# Patient Record
Sex: Female | Born: 1990 | Race: Black or African American | Hispanic: No | Marital: Single | Smoking: Never smoker
Health system: Southern US, Community
[De-identification: ages and names within clinical notes are randomized; demographics above are authoritative.]

## PROBLEM LIST (undated history)

## (undated) DIAGNOSIS — E669 Obesity, unspecified: Secondary | ICD-10-CM

## (undated) DIAGNOSIS — K219 Gastro-esophageal reflux disease without esophagitis: Secondary | ICD-10-CM

## (undated) HISTORY — DX: Obesity, unspecified: E66.9

---

## 2020-09-20 ENCOUNTER — Ambulatory Visit (INDEPENDENT_AMBULATORY_CARE_PROVIDER_SITE_OTHER): Payer: Self-pay | Admitting: Family Medicine

## 2020-09-20 ENCOUNTER — Other Ambulatory Visit: Payer: Self-pay

## 2020-09-20 ENCOUNTER — Other Ambulatory Visit: Payer: Self-pay | Admitting: Family Medicine

## 2020-09-20 VITALS — BP 118/82 | HR 83 | Ht 68.11 in | Wt 229.4 lb

## 2020-09-20 DIAGNOSIS — N926 Irregular menstruation, unspecified: Secondary | ICD-10-CM

## 2020-09-20 DIAGNOSIS — Z0289 Encounter for other administrative examinations: Secondary | ICD-10-CM

## 2020-09-20 DIAGNOSIS — Z3169 Encounter for other general counseling and advice on procreation: Secondary | ICD-10-CM | POA: Insufficient documentation

## 2020-09-20 LAB — POCT URINALYSIS DIP (MANUAL ENTRY)
Bilirubin, UA: NEGATIVE
Blood, UA: NEGATIVE
Glucose, UA: NEGATIVE mg/dL
Ketones, POC UA: NEGATIVE mg/dL
Leukocytes, UA: NEGATIVE
Nitrite, UA: NEGATIVE
Protein Ur, POC: NEGATIVE mg/dL
Spec Grav, UA: 1.01 (ref 1.010–1.025)
Urobilinogen, UA: 0.2 E.U./dL
pH, UA: 5.5 (ref 5.0–8.0)

## 2020-09-20 LAB — POCT URINE PREGNANCY: Preg Test, Ur: NEGATIVE

## 2020-09-20 NOTE — Patient Instructions (Addendum)
It was wonderful to see you today.  Thank you for choosing [facility].   - We will check blood work   - We will further evaluate your swallowing at follow up   Please call [phone] with any questions about today's appointment.  Follow up on October 25th at 850 AM (with [name])   Terisa Starr, MD  Family Medicine
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
## 2020-09-21 LAB — HCV AB W REFLEX TO QUANT PCR: HCV Ab: <0.1 s/co ratio (ref 0.0–0.9)

## 2020-09-21 LAB — HCV INTERPRETATION

## 2020-09-26 ENCOUNTER — Telehealth: Payer: Self-pay | Admitting: Family Medicine

## 2020-09-26 ENCOUNTER — Encounter: Payer: Self-pay | Admitting: Family Medicine

## 2020-09-26 NOTE — Telephone Encounter (Signed)
Patient only had part of refugee labs completed.   Negative Hep C. Needs remainder of labs at follow up.   Called with Swahili interpreter but mailbox full. Will send letter.   Terisa Starr, MD  Family Medicine Teaching Service

## 2020-10-17 ENCOUNTER — Other Ambulatory Visit: Payer: Medicaid Other

## 2020-10-17 ENCOUNTER — Other Ambulatory Visit: Payer: Self-pay

## 2020-10-17 ENCOUNTER — Ambulatory Visit (INDEPENDENT_AMBULATORY_CARE_PROVIDER_SITE_OTHER): Payer: Medicaid Other | Admitting: Family Medicine

## 2020-10-17 ENCOUNTER — Encounter: Payer: Self-pay | Admitting: Family Medicine

## 2020-10-17 VITALS — BP 110/80 | HR 80 | Ht 67.91 in | Wt 231.6 lb

## 2020-10-17 DIAGNOSIS — Z113 Encounter for screening for infections with a predominantly sexual mode of transmission: Secondary | ICD-10-CM | POA: Diagnosis not present

## 2020-10-17 DIAGNOSIS — F430 Acute stress reaction: Secondary | ICD-10-CM | POA: Diagnosis not present

## 2020-10-17 DIAGNOSIS — Z0000 Encounter for general adult medical examination without abnormal findings: Secondary | ICD-10-CM

## 2020-10-17 NOTE — Assessment & Plan Note (Signed)
Supportive listening provided. Offered CCM and SW, declined. Per patient's request, will reach out to patient's CM about housing.

## 2020-10-17 NOTE — Assessment & Plan Note (Signed)
We discussed healthy eating habits, moderate physical activity for 30 minutes 5 times per week (or 150 minutes), safe sex practices, avoiding tobacco products, safe alcohol consumption, and safe driving habits.   
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
## 2020-10-17 NOTE — Patient Instructions (Signed)
It was wonderful to see you today.  Please bring ALL of your medications with you to every visit.   Today we talked about: - Going to the lab--I will call you with results    Thank you for choosing [facility].   Please call [phone] with any questions about today's appointment.  Please be sure to schedule follow up at the front  desk before you leave today.   Terisa Starr, MD  Family Medicine

## 2020-10-18 LAB — CBC WITH DIFFERENTIAL/PLATELET
Basophils Absolute: 0 10*3/uL (ref 0.0–0.2)
Basos: 1 %
EOS (ABSOLUTE): 0.1 10*3/uL (ref 0.0–0.4)
Eos: 2 %
Hematocrit: 34.2 % (ref 34.0–46.6)
Hemoglobin: 10.2 g/dL — ABNORMAL LOW (ref 11.1–15.9)
Immature Grans (Abs): 0 10*3/uL (ref 0.0–0.1)
Immature Granulocytes: 0 %
Lymphocytes Absolute: 1.8 10*3/uL (ref 0.7–3.1)
Lymphs: 43 %
MCH: 20.7 pg — ABNORMAL LOW (ref 26.6–33.0)
MCHC: 29.8 g/dL — ABNORMAL LOW (ref 31.5–35.7)
MCV: 69 fL — ABNORMAL LOW (ref 79–97)
Monocytes Absolute: 0.4 10*3/uL (ref 0.1–0.9)
Monocytes: 9 %
Neutrophils Absolute: 1.8 10*3/uL (ref 1.4–7.0)
Neutrophils: 45 %
Platelets: 283 10*3/uL (ref 150–450)
RBC: 4.93 x10E6/uL (ref 3.77–5.28)
RDW: 17.7 % — ABNORMAL HIGH (ref 11.7–15.4)
WBC: 4.1 10*3/uL (ref 3.4–10.8)

## 2020-10-18 LAB — COMPREHENSIVE METABOLIC PANEL
ALT: 10 IU/L (ref 0–32)
AST: 10 IU/L (ref 0–40)
Albumin/Globulin Ratio: 1.4 (ref 1.2–2.2)
Albumin: 4.5 g/dL (ref 3.9–5.0)
Alkaline Phosphatase: 146 IU/L — ABNORMAL HIGH (ref 44–121)
BUN/Creatinine Ratio: 11 (ref 9–23)
BUN: 7 mg/dL (ref 6–20)
Bilirubin Total: <0.2 mg/dL (ref 0.0–1.2)
CO2: 20 mmol/L (ref 20–29)
Calcium: 9.5 mg/dL (ref 8.7–10.2)
Chloride: 103 mmol/L (ref 96–106)
Creatinine, Ser: 0.66 mg/dL (ref 0.57–1.00)
GFR calc Af Amer: 141 mL/min/{1.73_m2} (ref >59)
GFR calc non Af Amer: 122 mL/min/{1.73_m2} (ref >59)
Globulin, Total: 3.3 g/dL (ref 1.5–4.5)
Glucose: 88 mg/dL (ref 65–99)
Potassium: 4.2 mmol/L (ref 3.5–5.2)
Sodium: 135 mmol/L (ref 134–144)
Total Protein: 7.8 g/dL (ref 6.0–8.5)

## 2020-10-18 LAB — LIPID PANEL
Chol/HDL Ratio: 4.3 ratio (ref 0.0–4.4)
Cholesterol, Total: 193 mg/dL (ref 100–199)
HDL: 45 mg/dL (ref >39)
LDL Chol Calc (NIH): 137 mg/dL — ABNORMAL HIGH (ref 0–99)
Triglycerides: 60 mg/dL (ref 0–149)
VLDL Cholesterol Cal: 11 mg/dL (ref 5–40)

## 2020-10-18 LAB — HEPATITIS B SURFACE ANTIGEN: Hepatitis B Surface Ag: NEGATIVE

## 2020-10-18 LAB — RPR: RPR Ser Ql: NONREACTIVE

## 2020-10-18 LAB — HIV ANTIBODY (ROUTINE TESTING W REFLEX): HIV Screen 4th Generation wRfx: NONREACTIVE

## 2020-10-18 LAB — VARICELLA ZOSTER ANTIBODY, IGG: Varicella zoster IgG: 509 index (ref >165)

## 2020-10-18 LAB — TSH: TSH: 3.42 u[IU]/mL (ref 0.450–4.500)

## 2020-10-19 ENCOUNTER — Telehealth: Payer: Self-pay | Admitting: Family Medicine

## 2020-10-19 ENCOUNTER — Encounter: Payer: Self-pay | Admitting: Family Medicine

## 2020-10-19 NOTE — Telephone Encounter (Signed)
Called with Swahili interpreter.  Unable to leave VM. Will send letter, needs to start iron at follow up.  Terisa Starr, MD  Family Medicine Teaching Service

## 2020-10-20 ENCOUNTER — Other Ambulatory Visit: Payer: Self-pay

## 2020-10-20 ENCOUNTER — Ambulatory Visit (HOSPITAL_COMMUNITY)
Admission: EM | Admit: 2020-10-20 | Discharge: 2020-10-20 | Disposition: A | Discharge status: Home / Self Care | Payer: Medicaid Other | Attending: Family Medicine | Admitting: Family Medicine

## 2020-10-20 ENCOUNTER — Encounter (HOSPITAL_COMMUNITY): Payer: Self-pay

## 2020-10-20 DIAGNOSIS — S39012A Strain of muscle, fascia and tendon of lower back, initial encounter: Secondary | ICD-10-CM

## 2020-10-20 DIAGNOSIS — R5383 Other fatigue: Secondary | ICD-10-CM

## 2020-10-20 LAB — POCT URINALYSIS DIPSTICK, ED / UC
Bilirubin Urine: NEGATIVE
Glucose, UA: NEGATIVE mg/dL
Ketones, ur: NEGATIVE mg/dL
Leukocytes,Ua: NEGATIVE
Nitrite: NEGATIVE
Protein, ur: 30 mg/dL — AB
Specific Gravity, Urine: >=1.03 (ref 1.005–1.030)
Urobilinogen, UA: 0.2 mg/dL (ref 0.0–1.0)
pH: 5.5 (ref 5.0–8.0)

## 2020-10-20 LAB — POC URINE PREG, ED: Preg Test, Ur: NEGATIVE

## 2020-10-20 LAB — CBG MONITORING, ED: Glucose-Capillary: 84 mg/dL (ref 70–99)

## 2020-10-20 MED ORDER — DICLOFENAC SODIUM 75 MG PO TBEC: 75.0000 mg | DELAYED_RELEASE_TABLET | Freq: Two times a day (BID) | ORAL | 0 refills | Status: DC

## 2020-10-20 NOTE — Discharge Instructions (Addendum)
Your recent lab work does not show any reason for the fatigue you are feeling.  HOME CARE INSTRUCTIONS: For many people, back pain returns. Since low back pain is rarely dangerous, it is often a condition that people can learn to manage on their own. Please remain active. It is stressful on the back to sit or stand in one place. Do not sit, drive, or stand in one place for more than 30 minutes at a time. Take short walks on level surfaces as soon as pain allows. Try to increase the length of time you walk each day. Do not stay in bed. Resting more than 1 or 2 days can delay your recovery. Do not avoid exercise or work. Your body is made to move. It is not dangerous to be active, even though your back may hurt. Your back will likely heal faster if you return to being active before your pain is gone. Over-the-counter medicines to reduce pain and inflammation are often the most helpful.  SEEK MEDICAL CARE IF: You have pain that is not relieved with rest or medicine. You have pain that does not improve in 1 week. You have new symptoms. You are generally not feeling well.  SEEK IMMEDIATE MEDICAL CARE IF: You have pain that radiates from your back into your legs. You develop new bowel or bladder control problems. You have unusual weakness or numbness in your arms or legs. You develop nausea or vomiting. You develop abdominal pain. You feel faint.

## 2020-10-20 NOTE — ED Triage Notes (Signed)
Pt is here with dizziness with blurred/dark vision this started Monday after getting blood work done. Pt has not taken any meds to relieve discomfort.

## 2020-10-20 NOTE — ED Provider Notes (Signed)
[facility]   [id number] 10/20/20 Arrival Time: 1138  ASSESSMENT & PLAN:  1. Fatigue, unspecified type   2. Lumbar strain, initial encounter     Reviewed labs from 10/17/20. No abnormalities that would explain her current fatigue.  For lumbar strain, begin: Meds ordered this encounter  Medications  . diclofenac (VOLTAREN) 75 MG EC tablet    Sig: Take 1 tablet (75 mg total) by mouth 2 (two) times daily.    Dispense:  14 tablet    Refill:  0   Encouraged mobility. See AVS for d/c information.   Follow-up Information    Schedule an appointment as soon as possible for a visit  with Westley Chandler, MD.   Specialty: [facility] Contact information: [address] [place] [address] [phone]               Reviewed expectations re: course of current medical issues. Questions answered. Outlined signs and symptoms indicating need for more acute intervention. Understanding verbalized. After Visit Summary given.   SUBJECTIVE: History from: patient. Kathy Richards is a 29 y.o. female who reports feeling lightheaded and fatigued yesterday at work. Felt like she was going to pass out. Blurred vision that has not recurred. Still tired today. Slept ok last evening. No headaches/n/v. Ambulatory without difficulty. Also reports low back strain. Leans forward most of the day at work. No trauma. No extremity sensation changes or weakness. Normal bowel/bladder habits. No OTC tx. Patient's last menstrual period was 10/18/2020.      OBJECTIVE:  Vitals:   10/20/20 1301  BP: 125/84  Pulse: 67  Resp: 18  Temp: 98 F (36.7 C)  TempSrc: Oral  SpO2: 100%    General appearance: alert; no distress Eyes: PERRLA; EOMI; conjunctiva normal HENT: [place]; AT; without nasal congestion Neck: supple  Lungs: speaks full sentences without difficulty; unlabored Extremities: no edema Back: TTP over lumbar paraspinal musculature Skin: warm and dry Neurologic: normal  gait Psychological: alert and cooperative; normal mood and affect  Labs: Results for orders placed or performed during the hospital encounter of 10/20/20  POC CBG monitoring  Result Value Ref Range   Glucose-Capillary 84 70 - 99 mg/dL  POC Urinalysis dipstick  Result Value Ref Range   Glucose, UA NEGATIVE NEGATIVE mg/dL   Bilirubin Urine NEGATIVE NEGATIVE   Ketones, ur NEGATIVE NEGATIVE mg/dL   Specific Gravity, Urine >=1.030 1.005 - 1.030   Hgb urine dipstick LARGE (A) NEGATIVE   pH 5.5 5.0 - 8.0   Protein, ur 30 (A) NEGATIVE mg/dL   Urobilinogen, UA 0.2 0.0 - 1.0 mg/dL   Nitrite NEGATIVE NEGATIVE   Leukocytes,Ua NEGATIVE NEGATIVE  POC urine pregnancy  Result Value Ref Range   Preg Test, Ur NEGATIVE NEGATIVE      No Known Allergies  Past Medical History:  Diagnosis Date  . Obesity    Social History   Socioeconomic History  . Marital status: Married    Spouse name: Not on file  . Number of children: Not on file  . Years of education: Not on file  . Highest education level: Not on file  Occupational History  . Not on file  Tobacco Use  . Smoking status: Never Smoker  . Smokeless tobacco: Never Used  Substance and Sexual Activity  . Alcohol use: Not Currently  . Drug use: Never  . Sexual activity: Yes    Partners: Male    Birth control/protection: None    Comment: husband in [place]  Other Topics Concern  . Not on file  Social History Narrative   Mother is Mbiya Mulumba       Husband is still in [place]       Refugee Information   Number of Immediate Family Members: 8   Number of Immediate Family Members in [place]: 7   Date of Arrival: 08/03/20   Country of Birth: Other   Other Country of Birth:: [place]   Country of Origin: Other   Other Country of Origin:: [place]   Location of Refugee [place]: Other   Other Location of Refugee Camp:: [place]   Duration in [place]: 20 years or greater   Reason for Leaving Home Country: Other   Other Reason for Leaving Home  Country:: born in the refugee camp   Primary Language: Other   Other Primary Language:: swahili, nyanja and understanding of english   Able to Read in Primary Language: Yes (a little)   Able to Write in Primary Language: Yes (a little)   Education: [employer] (two years from completing)   Prior Work: business; Secondary school teacher and shoes   Marital Status: Married   Sexual Activity: No   Tuberculosis Screening Overseas: Negative   Tuberculosis Screening Health Department: Not Completed   Health Department Labs Completed: No   History of Trauma: None   Do You Feel Jumpy or Nervous?: No   Are You Very Watchful or 'Super Alert'?: Yes (feels unsafe in her neighborhood.)    Social Determinants of Health   Financial Resource Strain:   . Difficulty of Paying Living Expenses: Not on file  Food Insecurity:   . Worried About Programme researcher, broadcasting/film/video in the Last Year: Not on file  . Ran Out of Food in the Last Year: Not on file  Transportation Needs:   . Lack of Transportation (Medical): Not on file  . Lack of Transportation (Non-Medical): Not on file  Physical Activity:   . Days of Exercise per Week: Not on file  . Minutes of Exercise per Session: Not on file  Stress:   . Feeling of Stress : Not on file  Social Connections:   . Frequency of Communication with Friends and Family: Not on file  . Frequency of Social Gatherings with Friends and Family: Not on file  . Attends Religious Services: Not on file  . Active Member of Clubs or Organizations: Not on file  . Attends Banker Meetings: Not on file  . Marital Status: Not on file  Intimate Partner Violence:   . Fear of Current or Ex-Partner: Not on file  . Emotionally Abused: Not on file  . Physically Abused: Not on file  . Sexually Abused: Not on file   Family History  Problem Relation Age of Onset  . HIV Mother   . Hypertension Father    Past Surgical History:  Procedure Laterality Date  . CESAREAN  SECTION       Mardella Layman, MD 10/20/20 1406

## 2020-11-21 ENCOUNTER — Encounter: Payer: Self-pay | Admitting: Family Medicine

## 2020-11-21 ENCOUNTER — Other Ambulatory Visit (HOSPITAL_COMMUNITY)
Admission: RE | Admit: 2020-11-21 | Discharge: 2020-11-21 | Disposition: A | Discharge status: Home / Self Care | Payer: Medicaid Other | Source: Ambulatory Visit | Attending: Family Medicine | Admitting: Family Medicine

## 2020-11-21 ENCOUNTER — Ambulatory Visit (INDEPENDENT_AMBULATORY_CARE_PROVIDER_SITE_OTHER): Payer: Medicaid Other | Admitting: Family Medicine

## 2020-11-21 ENCOUNTER — Other Ambulatory Visit: Payer: Self-pay

## 2020-11-21 VITALS — BP 122/80 | HR 85 | Wt 237.8 lb

## 2020-11-21 DIAGNOSIS — Z124 Encounter for screening for malignant neoplasm of cervix: Secondary | ICD-10-CM | POA: Diagnosis not present

## 2020-11-21 DIAGNOSIS — D509 Iron deficiency anemia, unspecified: Secondary | ICD-10-CM

## 2020-11-21 DIAGNOSIS — F430 Acute stress reaction: Secondary | ICD-10-CM

## 2020-11-21 DIAGNOSIS — E669 Obesity, unspecified: Secondary | ICD-10-CM

## 2020-11-21 HISTORY — DX: Iron deficiency anemia, unspecified: D50.9
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
## 2020-11-21 NOTE — Patient Instructions (Addendum)
It was wonderful to see you today.  Please bring ALL of your medications with you to every visit.   Today we talked about:  -- Following up in January to check in on menses  -- Going to the lab  -- I will call you with results in 1 week     Thank you for choosing [facility].   Please call [phone] with any questions about today's appointment.  Please be sure to schedule follow up at the front  desk before you leave today.   Terisa Starr, MD  Family Medicine
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
## 2020-11-21 NOTE — Assessment & Plan Note (Signed)
Hemoglobin A1c today.

## 2020-11-21 NOTE — Assessment & Plan Note (Signed)
Likely due to menorrhagia and underlying iron deficiency.  Obtain ferritin, lead given country of origin and recent birth in February and hemoglobin electrophoresis.  Will call with results.

## 2020-11-21 NOTE — Assessment & Plan Note (Signed)
Pap smear obtained today. 

## 2020-11-23 ENCOUNTER — Telehealth: Payer: Self-pay | Admitting: Family Medicine

## 2020-11-23 ENCOUNTER — Encounter: Payer: Self-pay | Admitting: Family Medicine

## 2020-11-23 LAB — CBC
Hematocrit: 30.3 % — ABNORMAL LOW (ref 34.0–46.6)
Hemoglobin: 9.1 g/dL — ABNORMAL LOW (ref 11.1–15.9)
MCH: 20.4 pg — ABNORMAL LOW (ref 26.6–33.0)
MCHC: 30 g/dL — ABNORMAL LOW (ref 31.5–35.7)
MCV: 68 fL — ABNORMAL LOW (ref 79–97)
Platelets: 289 10*3/uL (ref 150–450)
RBC: 4.45 x10E6/uL (ref 3.77–5.28)
RDW: 16.3 % — ABNORMAL HIGH (ref 11.7–15.4)
WBC: 4.3 10*3/uL (ref 3.4–10.8)

## 2020-11-23 LAB — HGB FRACTIONATION CASCADE
Hgb A2: 2.4 % (ref 1.8–3.2)
Hgb A: 97.6 % (ref 96.4–98.8)
Hgb F: 0 % (ref 0.0–2.0)
Hgb S: 0 %

## 2020-11-23 LAB — HEMOGLOBIN A1C
Est. average glucose Bld gHb Est-mCnc: 111 mg/dL
Hgb A1c MFr Bld: 5.5 % (ref 4.8–5.6)

## 2020-11-23 LAB — LEAD, BLOOD (ADULT >= 16 YRS): Lead-Whole Blood: 3 ug/dL (ref 0–4)

## 2020-11-23 LAB — FERRITIN: Ferritin: 7 ng/mL — ABNORMAL LOW (ref 15–150)

## 2020-11-23 LAB — CYTOLOGY - PAP
Adequacy: ABSENT
Diagnosis: NEGATIVE

## 2020-11-23 NOTE — Telephone Encounter (Signed)
Attempted to call patient with results.  Results show iron deficiency---recommend starting iron, this is available OTC as ferrous sulfate, take 1 daily. Monitor for constipation.  Pap is normal, repeat in 3 years.  If patient calls back, please ask her what time/day works good for her. Will need repeat CBC at follow up, discuss COC.  Terisa Starr, MD  Family Medicine Teaching Service

## 2020-12-05 ENCOUNTER — Telehealth: Payer: Self-pay | Admitting: Family Medicine

## 2020-12-05 DIAGNOSIS — E611 Iron deficiency: Secondary | ICD-10-CM

## 2020-12-05 MED ORDER — FERROUS SULFATE 324 MG PO TBEC: 324.0000 mg | DELAYED_RELEASE_TABLET | Freq: Every day | ORAL | 3 refills | Status: DC

## 2020-12-05 NOTE — Telephone Encounter (Signed)
Called patient with Swahili interpreter.   Discussed labs which show iron deficiency anemia. Rx for ferrous sulfate to pharmacy, repeat CBC at follow up. Discuss contraception. Etiology is heavy menses. Discussed with patient--multiple barriers to obtaining medications.   Patient would like to consider starting pill for heavy menses--she is having irregular cycles. Requested earlier appointment, one was scheduled, discussed time, date, location, total time on phone 19 minutes.   Terisa Starr, MD  Family Medicine Teaching Service

## 2020-12-09 ENCOUNTER — Other Ambulatory Visit: Payer: Self-pay

## 2020-12-09 ENCOUNTER — Encounter: Payer: Self-pay | Admitting: Family Medicine

## 2020-12-09 ENCOUNTER — Ambulatory Visit (INDEPENDENT_AMBULATORY_CARE_PROVIDER_SITE_OTHER): Payer: Medicaid Other | Admitting: Family Medicine

## 2020-12-09 VITALS — BP 102/60 | HR 71 | Wt 234.0 lb

## 2020-12-09 DIAGNOSIS — D509 Iron deficiency anemia, unspecified: Secondary | ICD-10-CM

## 2020-12-09 LAB — POCT HEMOGLOBIN: Hemoglobin: 8.2 g/dL — AB (ref 11–14.6)

## 2020-12-09 MED ORDER — NORGESTIMATE-ETH ESTRADIOL 0.25-35 MG-MCG PO TABS: 1.0000 | ORAL_TABLET | Freq: Every day | ORAL | 11 refills | Status: DC

## 2020-12-09 NOTE — Progress Notes (Signed)
    SUBJECTIVE:   CHIEF COMPLAINT / HPI:   Patient presents to follow up on anemia. An interpreter was used for visit.   Ferrous sulfate was started on 12/13. She is tolerating it well with no side effects. Patient just completed her menstrual cycle yesterday, 12/16. Reports irregular menses since her C section in Feb this year. States she has 2 cycles a month lasting 5-7 days. She still feels intermittent dizziness but denies current chest pain or palpitations. Does report 1 episode of syncope since Feb.   Discussed OCPs which patient is amenable to. She states she is tired of feeling this way and wants to do something about the bleeding. Patient states she does not want another blood draw today, and would prefer point of care hemoglobin check.   PERTINENT  PMH / PSH:  Microcytic anemia, obesity   OBJECTIVE:   There were no vitals taken for this visit.   General: well appearing, pleasant, NAD Cardiovascular: RRR no murmurs Respiratory: CTAB. Normal WOB Skin: warm, dry. No edema   ASSESSMENT/PLAN:   No problem-specific Assessment & Plan notes found for this encounter.   Iron defeciency anemia POC Hgb 8.2, down from 9.1 on 11/29. Likely not to be accurate, as it was hard to extract blood from finger. Patient declined blood draw at this visit. Recommended that she get one at her next follow up since this Hgb likely not accurate.  - con't ferrous sulfate  - start Sprintec qd  - f/u 3-4 weeks to check on effects of iron and obtain CBC  Cora Collum, DO [facility]

## 2020-12-09 NOTE — Patient Instructions (Signed)
It was great seeing you today! You came in to follow up on your anemia. We discussed oral contraceptives and I am sending that to your pharmacy. Please continue to take your iron supplement as well.  Please check-out at the front desk before leaving the clinic. Please schedule an appointment for a follow up in 3-4 weeks to check on your anemia, but if you need to be seen earlier than that for any new issues we're happy to fit you in, just give [place] a call!  Visit Reminders: - Stop by the pharmacy to pick up your prescription  - Continue to work on your healthy eating habits and incorporating exercise into your daily life.  - To Do: Continue taking iron supplement   Feel free to call with any questions or concerns at any time, at [phone].   Take care,  Dr. Cora Collum [facility]

## 2020-12-26 ENCOUNTER — Ambulatory Visit: Payer: Medicaid Other | Admitting: Family Medicine

## 2020-12-27 NOTE — Progress Notes (Deleted)
    SUBJECTIVE:   CHIEF COMPLAINT / HPI: f/u for menses and anemia   Patient evaluated on 12/17 for menses and hx of microcytic anemia. Hgb POCT was 8.6 at that time, she was started on OCP and ferrous sulfate supplementation.  Today she reports ***   PERTINENT  PMH / PSH:  Microcytic anemia   OBJECTIVE:   LMP 12/03/2020   Physical Exam  General: female appearing stated age in no acute distress HEENT:conjunctival pallor***, MMM, no oral lesions noted,Neck non-tender without lymphadenopathy, masses or thyromegaly*** Cardio: Normal S1 and S2, no S3 or S4. Rhythm is regular***. No murmurs or rubs.  Bilateral radial pulses palpable Pulm: Clear to auscultation bilaterally, no crackles, wheezing, or diminished breath sounds. Normal respiratory effort, stable on *** Abdomen: Bowel sounds normal. Abdomen soft and non-tender. *** Extremities: No peripheral edema. Warm/ well perfused. *** Neuro: pt alert and oriented x4   ASSESSMENT/PLAN:   No problem-specific Assessment & Plan notes found for this encounter.     Ronnald Ramp, MD [facility]

## 2020-12-28 ENCOUNTER — Ambulatory Visit: Payer: Medicaid Other

## 2021-01-02 ENCOUNTER — Telehealth: Payer: Self-pay | Admitting: Family Medicine

## 2021-01-02 NOTE — Telephone Encounter (Signed)
Attempted to call X1. Unable to leave VM. Will try in AM (works in PM).  Terisa Starr, MD  Family Medicine Teaching Service

## 2021-01-02 NOTE — Telephone Encounter (Signed)
Pt walked in requesting Dr. Manson Passey to call her regarding her medication. Having complications related to the medication.  [phone]

## 2021-01-03 MED ORDER — NORGESTIMATE-ETH ESTRADIOL 0.25-35 MG-MCG PO TABS: 1.0000 | ORAL_TABLET | Freq: Every day | ORAL | 3 refills | Status: DC

## 2021-01-03 NOTE — Addendum Note (Signed)
Addended by: Manson Passey, Preeti Winegardner on: 01/03/2021 08:50 AM   Modules accepted: Orders

## 2021-01-03 NOTE — Telephone Encounter (Signed)
Attempted to call patient X2. If patient calls back or comes in again, please ask which medication needs refilled/issue with medication.  Terisa Starr, MD  Family Medicine Teaching Service

## 2021-01-09 ENCOUNTER — Ambulatory Visit: Payer: Medicaid Other | Admitting: Family Medicine

## 2021-01-20 ENCOUNTER — Ambulatory Visit
Admission: RE | Admit: 2021-01-20 | Discharge: 2021-01-20 | Disposition: A | Discharge status: Home / Self Care | Payer: No Typology Code available for payment source | Source: Ambulatory Visit | Attending: Obstetrics and Gynecology | Admitting: Obstetrics and Gynecology

## 2021-01-20 ENCOUNTER — Other Ambulatory Visit: Payer: Self-pay | Admitting: Obstetrics and Gynecology

## 2021-01-20 DIAGNOSIS — R7612 Nonspecific reaction to cell mediated immunity measurement of gamma interferon antigen response without active tuberculosis: Secondary | ICD-10-CM

## 2021-01-23 ENCOUNTER — Encounter: Payer: Self-pay | Admitting: Family Medicine

## 2021-01-23 ENCOUNTER — Ambulatory Visit: Payer: Medicaid Other | Admitting: Family Medicine

## 2021-01-23 ENCOUNTER — Other Ambulatory Visit: Payer: Self-pay

## 2021-01-23 VITALS — BP 124/84 | HR 78 | Wt 233.4 lb

## 2021-01-23 DIAGNOSIS — N926 Irregular menstruation, unspecified: Secondary | ICD-10-CM

## 2021-01-23 DIAGNOSIS — D509 Iron deficiency anemia, unspecified: Secondary | ICD-10-CM | POA: Diagnosis present

## 2021-01-23 DIAGNOSIS — N946 Dysmenorrhea, unspecified: Secondary | ICD-10-CM

## 2021-01-23 MED ORDER — NAPROXEN 500 MG PO TABS: 500.0000 mg | ORAL_TABLET | Freq: Two times a day (BID) | ORAL | 0 refills | Status: DC

## 2021-01-23 MED ORDER — MEDROXYPROGESTERONE ACETATE 10 MG PO TABS: 10.0000 mg | ORAL_TABLET | Freq: Every day | ORAL | 0 refills | Status: DC

## 2021-01-23 NOTE — Patient Instructions (Signed)
It was wonderful to see you today.  Please bring ALL of your medications with you to every visit.   Today we talked about:  --Getting an ultrasound-- my nurse will let you know the time and date  -- Going to the lab for blood work   --Picking up naproxen with paper prescription    Thank you for choosing [facility].   Please call [phone] with any questions about today's appointment.  Please be sure to schedule follow up at the front  desk before you leave today.   Terisa Starr, MD  Family Medicine

## 2021-01-23 NOTE — Progress Notes (Signed)
    SUBJECTIVE:   CHIEF COMPLAINT / HPI:   Kathy Richards is a pleasant 29 year with history of menorrhagia presenting today for ongoing menstrual issues.   The patient has had ongoing menstrual issues for 'a long time'. She was on Depo while in [place] and reports she has not had regular menses since. She is not sexually active. She was started on COCs in December and took the medications (one pill per day) for four week. She reports she had four weeks of bleeding while on the pills. Associated with + cramps. Denies easy bruising or bleeding. Endorses cold intolerance and fatigue. After completing the visit, the patient requested a medication to 'stop her bleeding this week'. No personal or family history of VTE.   The patient reports overall she is doing better.  She reports her mood is improved.  She is now living with her mom and feels safe in her home.  Her daughter is doing well in school.  She does have issues with ongoing dizziness and cold intolerance related to her anemia and requests a work note for this.  PERTINENT  PMH / PSH/Family/Social History : Notable for obesity   OBJECTIVE:   BP 124/84   Pulse 78   Wt 233 lb 6.4 oz (105.9 kg)   SpO2 98%   BMI 35.58 kg/m   Today's weight:  Last Weight  Most recent update: 01/23/2021  9:36 AM   Weight  105.9 kg (233 lb 6.4 oz)           Review of prior weights: Filed Weights   01/23/21 0936  Weight: 233 lb 6.4 oz (105.9 kg)    Cardiac: Regular rate and rhythm. Normal S1/S2. No murmurs, rubs, or gallops appreciated. Lungs: Clear bilaterally to ascultation.  Abdomen: Normoactive bowel sounds. No tenderness to deep or light palpation. No rebound or guarding.    Psych: Pleasant and appropriate  Patient deferred pelvic as she has started menses    Lab review patient declined additional labs today.   ASSESSMENT/PLAN:   Microcytic anemia Related to menorrhagia.  The patient has irregular and heavy menses.  This is been  ongoing for several years.  Prior evaluation has included a TSH and A1c.  Both were normal.  Prior pelvic exam unremarkable and Pap was normal.  We discussed effective treatment options for this.  We will explore additional etiologies.  Pelvic ultrasound ordered and scheduled.  Recommended repeat ferritin and CBC today which the patient declined.  Discussed options at length.  Discussed Provera which will cause a withdrawal bleed after she starts this however patient is adamant she would like something today.  Prescription for 10 mg of Provera for 10 days prescribed.  Discussed side effects and anticipated withdrawal bleeding after this medication.  Recommended NSAIDs for cramping.   HCM Due for Hep and varicella at follow up   Terisa Starr, MD  Family Medicine Teaching Service  [facility]

## 2021-01-23 NOTE — Assessment & Plan Note (Signed)
Related to menorrhagia.  The patient has irregular and heavy menses.  This is been ongoing for several years.  Prior evaluation has included a TSH and A1c.  Both were normal.  Prior pelvic exam unremarkable and Pap was normal.  We discussed effective treatment options for this.  We will explore additional etiologies.  Pelvic ultrasound ordered and scheduled.  Recommended repeat ferritin and CBC today which the patient declined.  Discussed options at length.  Discussed Provera which will cause a withdrawal bleed after she starts this however patient is adamant she would like something today.  Prescription for 10 mg of Provera for 10 days prescribed.  Discussed side effects and anticipated withdrawal bleeding after this medication.  Recommended NSAIDs for cramping.

## 2021-01-24 ENCOUNTER — Telehealth: Payer: Self-pay | Admitting: Family Medicine

## 2021-01-24 NOTE — Telephone Encounter (Signed)
Patient declined blood work on 1/31. Ultrasound scheduled on Friday to assess follow up of menorrhagia, check in with Dr. Clayborne Artist after this to review results and obtain blood work to which is patient is agreeable.  Terisa Starr, MD  Family Medicine Teaching Service

## 2021-01-27 ENCOUNTER — Ambulatory Visit (HOSPITAL_COMMUNITY)
Admission: RE | Admit: 2021-01-27 | Discharge: 2021-01-27 | Disposition: A | Discharge status: Home / Self Care | Payer: Medicaid Other | Source: Ambulatory Visit | Attending: Family Medicine | Admitting: Family Medicine

## 2021-01-27 ENCOUNTER — Encounter: Payer: Self-pay | Admitting: Family Medicine

## 2021-01-27 ENCOUNTER — Ambulatory Visit (INDEPENDENT_AMBULATORY_CARE_PROVIDER_SITE_OTHER): Payer: Medicaid Other | Admitting: Family Medicine

## 2021-01-27 ENCOUNTER — Other Ambulatory Visit: Payer: Self-pay

## 2021-01-27 VITALS — BP 90/78 | HR 63 | Ht 67.0 in | Wt 235.0 lb

## 2021-01-27 DIAGNOSIS — D509 Iron deficiency anemia, unspecified: Secondary | ICD-10-CM | POA: Diagnosis present

## 2021-01-27 NOTE — Progress Notes (Signed)
    SUBJECTIVE:  Swahili video interpreter present during entire visit.  CHIEF COMPLAINT / HPI:   Menorrhagia with anemia: Patient previously seen (01/23/21) in the clinic for heavy menses. At last visit, patient was placed on Provera for 10 days. Patient states that the Provera has helped, but only for a few hours after she takes the pill, then the menstrual flow becomes stronger again. Patient reported no cramps present currently. Patient does reported a chronic history of dizziness that has been present for years; she reports she fell last year when walking and has had several episodes of lightheadedness in which she felt she was going to fall down. She also report some mild dyspnea when walking, but that this has been present for several years.   PERTINENT  PMH / PSH: Obesity  OBJECTIVE:   BP 100/70   Pulse 63   Ht 5\' 7"  (1.702 m)   Wt 235 lb (106.6 kg)   LMP 01/23/2021   SpO2 99%   BMI 36.81 kg/m    Gen: NAD, sitting in clinic, not engaging very much--looking at her phone a lot with flatter affect CV: RRR, no murmur appreciated, 2+ radial pulses Pulm: CTAB, normal WOB, comfortable on room air   ASSESSMENT/PLAN:   Menorrhagia with anemia: Patient reports some improvement on Provera, will complete full 10 day course. Reiterated withdrawal bleeding previously discussed with Dr. 01/25/2021. Patient declined pelvic exam today, completed pelvic Manson Passey early today which has not yet been read. - CBC and ferritin today - Follow-up US completed today prior to clinic visit.   HCM - Due for Hep and varicella at follow-up, not administered today    [place], DO  [facility]

## 2021-01-27 NOTE — Patient Instructions (Addendum)
It was wonderful to see you today.  Please bring ALL of your medications with you to every visit.   Today we talked about:  --Your dizziness when standing, we are checking lab work to make sure your blood count is okay  -- Blood work was collected today  --We will call you with results of your blood work as well as your ultrasound   Thank you for choosing [facility].   Please call [phone] with any questions about today's appointment.  Please be sure to schedule follow up at the front  desk before you leave today.

## 2021-01-28 ENCOUNTER — Telehealth: Payer: Self-pay | Admitting: Family Medicine

## 2021-01-28 LAB — CBC WITH DIFFERENTIAL/PLATELET
Basophils Absolute: 0 10*3/uL (ref 0.0–0.2)
Basos: 1 %
EOS (ABSOLUTE): 0.1 10*3/uL (ref 0.0–0.4)
Eos: 2 %
Hematocrit: 28 % — ABNORMAL LOW (ref 34.0–46.6)
Hemoglobin: 8.1 g/dL — ABNORMAL LOW (ref 11.1–15.9)
Immature Grans (Abs): 0 10*3/uL (ref 0.0–0.1)
Immature Granulocytes: 0 %
Lymphocytes Absolute: 2.4 10*3/uL (ref 0.7–3.1)
Lymphs: 48 %
MCH: 19 pg — ABNORMAL LOW (ref 26.6–33.0)
MCHC: 28.9 g/dL — ABNORMAL LOW (ref 31.5–35.7)
MCV: 66 fL — ABNORMAL LOW (ref 79–97)
Monocytes Absolute: 0.4 10*3/uL (ref 0.1–0.9)
Monocytes: 8 %
Neutrophils Absolute: 2.1 10*3/uL (ref 1.4–7.0)
Neutrophils: 41 %
Platelets: 292 10*3/uL (ref 150–450)
RBC: 4.26 x10E6/uL (ref 3.77–5.28)
RDW: 15.6 % — ABNORMAL HIGH (ref 11.7–15.4)
WBC: 5 10*3/uL (ref 3.4–10.8)

## 2021-01-28 LAB — FERRITIN: Ferritin: 5 ng/mL — ABNORMAL LOW (ref 15–150)

## 2021-01-28 NOTE — Telephone Encounter (Signed)
Called with Swahili interpreter  Attempted to call, voicemail full.  Also attempted to call brother (emergency contact).   Will continue to try next week. Hemoglobin down slightly, she confirmed on 1/31 she is taking iron. May need IUD + IV iron in future.   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   Terisa Starr, MD  Family Medicine Teaching Service

## 2021-01-30 ENCOUNTER — Telehealth: Payer: Self-pay | Admitting: Family Medicine

## 2021-01-30 NOTE — Telephone Encounter (Signed)
Called with Swahili interpreter. Attempted to call patient again regarding ultrasound and anemia. Unable to leave message. Left message with contact (Ntumba-brother).  If family/patient calls back please identify time that patient can be reached.  Thanks, Terisa Starr, MD  [facility] Medicine Teaching Service

## 2021-02-02 ENCOUNTER — Encounter: Payer: Self-pay | Admitting: Family Medicine

## 2021-02-02 ENCOUNTER — Telehealth: Payer: Self-pay | Admitting: Family Medicine

## 2021-02-02 NOTE — Telephone Encounter (Signed)
Attempted to call X 3 with interpreter.  Unable to reach patient, sent letter. If patient calls, please find good number/time for me to call her about anemia and ultrasound.   Please also remind her of March follow up with Dr. Clayborne Artist.  Terisa Starr, MD  Family Medicine Teaching Service

## 2021-02-24 ENCOUNTER — Ambulatory Visit: Payer: Medicaid Other | Admitting: Family Medicine

## 2021-02-24 ENCOUNTER — Encounter: Payer: Self-pay | Admitting: Family Medicine

## 2021-03-17 ENCOUNTER — Ambulatory Visit: Payer: Medicaid Other | Admitting: Family Medicine

## 2021-03-20 ENCOUNTER — Other Ambulatory Visit: Payer: Self-pay

## 2021-03-20 ENCOUNTER — Ambulatory Visit (INDEPENDENT_AMBULATORY_CARE_PROVIDER_SITE_OTHER): Payer: Medicaid Other | Admitting: Family Medicine

## 2021-03-20 ENCOUNTER — Other Ambulatory Visit: Payer: Self-pay | Admitting: Family Medicine

## 2021-03-20 ENCOUNTER — Telehealth: Payer: Self-pay

## 2021-03-20 ENCOUNTER — Encounter: Payer: Self-pay | Admitting: Family Medicine

## 2021-03-20 VITALS — BP 92/60 | HR 81 | Ht 67.0 in | Wt 231.5 lb

## 2021-03-20 DIAGNOSIS — N939 Abnormal uterine and vaginal bleeding, unspecified: Secondary | ICD-10-CM | POA: Insufficient documentation

## 2021-03-20 DIAGNOSIS — D509 Iron deficiency anemia, unspecified: Secondary | ICD-10-CM | POA: Diagnosis not present

## 2021-03-20 MED ORDER — FERUMOXYTOL INJECTION 510 MG/17 ML: 510.0000 mg | Freq: Once | INTRAVENOUS | 0 refills | Status: DC

## 2021-03-20 NOTE — Assessment & Plan Note (Signed)
Derry to menorrhagia.  She is consistent with her oral iron intake although she continues to experience symptoms of shortness of breath and occasional dizziness.  I would like to move forward with an IV iron infusion with symptom improvement. -Continue oral iron -Order faxed for IV Feraheme -CMA to call the infusion clinic for scheduling

## 2021-03-20 NOTE — Telephone Encounter (Signed)
Infusion clinic appt for Iron is for Apr. 6th at 9:45a at [facility] 1st floor. Check in on the 2nd floor at admissions, and the infusion clinic will be on the 1st floor across from radiology. Will call pt to inform of appt.  Aquilla Solian, CMA

## 2021-03-20 NOTE — Assessment & Plan Note (Signed)
Ultrasound results reviewed which included normal-appearing uterus and endometrium.  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  The significance of these pelvic varices are unclear to me at this time.  I would like her to speak with a gynecologist for the best way forward for control of her abnormal uterine bleeding. -Placed ambulatory referral to gynecology

## 2021-03-20 NOTE — Progress Notes (Signed)
    SUBJECTIVE:   CHIEF COMPLAINT / HPI:   Abnormal uterine bleeding She was last seen in clinic for this issue on 2/4 at which time blood work was done and she was encouraged to continue a course of Provera.  Since then, she has finished her course of Provera which she did not find particularly helpful.  She still reports significant vaginal bleeding.  She has had her ultrasound done is interested in the ultrasound results.  She continues to take her iron supplement every day.  She does feel occasional shortness of breath and dizziness.   PERTINENT  PMH / PSH: Abnormal uterine bleeding, anemia, refugee  OBJECTIVE:   BP 92/60   Pulse 81   Ht 5\' 7"  (1.702 m)   Wt 231 lb 8 oz (105 kg)   LMP 02/11/2021   SpO2 98%   BMI 36.26 kg/m    General: Alert and cooperative and appears to be in no acute distress HEENT: Pale conjunctiva.  Normal oropharyngeal coloring. Cardio: Normal S1 and S2, no S3 or S4. Rhythm is regular. No murmurs or rubs.   Pulm: Clear to auscultation bilaterally, no crackles, wheezing, or diminished breath sounds. Normal respiratory effort Extremities: No peripheral edema. Warm/ well perfused.  Strong radial pulses.  ASSESSMENT/PLAN:   Abnormal uterine bleeding Ultrasound results reviewed which included normal-appearing uterus and endometrium.  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  The significance of these pelvic varices are unclear to me at this time.  I would like her to speak with a gynecologist for the best way forward for control of her abnormal uterine bleeding. -Placed ambulatory referral to gynecology  Microcytic anemia Derry to menorrhagia.  She is consistent with her oral iron intake although she continues to experience symptoms of shortness of breath and occasional dizziness.  I would like to move forward with an IV iron infusion with symptom improvement. -Continue oral iron -Order faxed for IV Feraheme -CMA to call the infusion clinic for scheduling      02/13/2021, MD [facility] Medicine Center

## 2021-03-20 NOTE — Telephone Encounter (Signed)
Patient would like a reminder call before appt. Kathy Richards, CMA

## 2021-03-20 NOTE — Patient Instructions (Signed)
Abnormal uterine bleeding: The ultrasound showed that you have a normal uterus.  It also showed that you have enlarged blood vessels in your pelvis.  I have put in a referral to gynecology to have a longer discussion about your abnormal uterine bleeding.  There may be procedures we can do to help control the bleeding.  Anemia: As a result of your bleeding, your blood levels have been low and you are experiencing some shortness of breath and dizziness.  We are going to get you IV iron which will help build up your blood levels and help you feel better.  This will not solve the problem but will help some of the discomfort you are feeling.  Kutokwa na damu kwa njia isiyo ya [place] ya Deon Pilling: Ivor Reining wa ultrasound ulionyesha kuwa una Len Childs. Albertina [place] kuwa umepanua mishipa ya damu kwenye pelvisi yako. Nimetoa rufaa kwa magonjwa ya [place] ili kuwa na majadiliano marefu kuhusu kutokwa na damu kwa uterasi Estrellita Ludwig. Tamsen Roers na taratibu ambazo tunaweza kufanya ili kusaidia kudhibiti uvujaji wa damu.  Anemia: [place] na damu yako, viwango vyako vya damu vimekuwa chini na unapata upungufu wa kupumua na kizunguzungu. Kandis Ban IV ambacho kitakusaidia [place] viwango vyako vya damu na kukusaidia kujisikia vizuri. Hii haitasuluhisha shida lakini itasaidia baadhi ya usumbufu unaohisi.

## 2021-03-21 LAB — CBC
Hematocrit: 31.6 % — ABNORMAL LOW (ref 34.0–46.6)
Hemoglobin: 8.6 g/dL — ABNORMAL LOW (ref 11.1–15.9)
MCH: 17.3 pg — ABNORMAL LOW (ref 26.6–33.0)
MCHC: 27.2 g/dL — ABNORMAL LOW (ref 31.5–35.7)
MCV: 64 fL — ABNORMAL LOW (ref 79–97)
Platelets: 304 10*3/uL (ref 150–450)
RBC: 4.98 x10E6/uL (ref 3.77–5.28)
RDW: 16.8 % — ABNORMAL HIGH (ref 11.7–15.4)
WBC: 4.7 10*3/uL (ref 3.4–10.8)

## 2021-03-28 ENCOUNTER — Other Ambulatory Visit (HOSPITAL_COMMUNITY): Payer: Self-pay | Admitting: *Deleted

## 2021-03-29 ENCOUNTER — Encounter (HOSPITAL_COMMUNITY): Admission: RE | Admit: 2021-03-29 | Payer: Medicaid Other | Source: Ambulatory Visit

## 2021-03-29 ENCOUNTER — Encounter (HOSPITAL_COMMUNITY): Payer: Medicaid Other

## 2021-04-10 NOTE — Telephone Encounter (Signed)
Patient asked for me to cancel her appt. Aquilla Solian, CMA

## 2021-04-20 ENCOUNTER — Emergency Department (HOSPITAL_COMMUNITY)
Admission: EM | Admit: 2021-04-20 | Discharge: 2021-04-20 | Disposition: A | Discharge status: Home / Self Care | Payer: Medicaid Other | Attending: Emergency Medicine | Admitting: Emergency Medicine

## 2021-04-20 ENCOUNTER — Emergency Department (HOSPITAL_COMMUNITY): Payer: Medicaid Other

## 2021-04-20 ENCOUNTER — Other Ambulatory Visit: Payer: Self-pay

## 2021-04-20 ENCOUNTER — Encounter (HOSPITAL_COMMUNITY): Payer: Self-pay | Admitting: *Deleted

## 2021-04-20 DIAGNOSIS — D649 Anemia, unspecified: Secondary | ICD-10-CM | POA: Diagnosis not present

## 2021-04-20 DIAGNOSIS — R55 Syncope and collapse: Secondary | ICD-10-CM | POA: Insufficient documentation

## 2021-04-20 DIAGNOSIS — M62838 Other muscle spasm: Secondary | ICD-10-CM | POA: Insufficient documentation

## 2021-04-20 LAB — COMPREHENSIVE METABOLIC PANEL
ALT: 10 U/L (ref 0–44)
AST: 16 U/L (ref 15–41)
Albumin: 3.8 g/dL (ref 3.5–5.0)
Alkaline Phosphatase: 108 U/L (ref 38–126)
Anion gap: 8 (ref 5–15)
BUN: 8 mg/dL (ref 6–20)
CO2: 24 mmol/L (ref 22–32)
Calcium: 9.4 mg/dL (ref 8.9–10.3)
Chloride: 104 mmol/L (ref 98–111)
Creatinine, Ser: 0.67 mg/dL (ref 0.44–1.00)
GFR, Estimated: >60 mL/min (ref >60)
Glucose, Bld: 104 mg/dL — ABNORMAL HIGH (ref 70–99)
Potassium: 3.7 mmol/L (ref 3.5–5.1)
Sodium: 136 mmol/L (ref 135–145)
Total Bilirubin: 0.3 mg/dL (ref 0.3–1.2)
Total Protein: 7.3 g/dL (ref 6.5–8.1)

## 2021-04-20 LAB — CBC
HCT: 29 % — ABNORMAL LOW (ref 36.0–46.0)
Hemoglobin: 8.1 g/dL — ABNORMAL LOW (ref 12.0–15.0)
MCH: 18 pg — ABNORMAL LOW (ref 26.0–34.0)
MCHC: 27.9 g/dL — ABNORMAL LOW (ref 30.0–36.0)
MCV: 64.4 fL — ABNORMAL LOW (ref 80.0–100.0)
Platelets: 275 10*3/uL (ref 150–400)
RBC: 4.5 MIL/uL (ref 3.87–5.11)
RDW: 18.6 % — ABNORMAL HIGH (ref 11.5–15.5)
WBC: 6.9 10*3/uL (ref 4.0–10.5)
nRBC: 0 % (ref 0.0–0.2)

## 2021-04-20 LAB — RAPID URINE DRUG SCREEN, HOSP PERFORMED
Amphetamines: NOT DETECTED
Barbiturates: NOT DETECTED
Benzodiazepines: NOT DETECTED
Cocaine: NOT DETECTED
Opiates: NOT DETECTED
Tetrahydrocannabinol: NOT DETECTED

## 2021-04-20 LAB — URINALYSIS, ROUTINE W REFLEX MICROSCOPIC
Bilirubin Urine: NEGATIVE
Glucose, UA: NEGATIVE mg/dL
Hgb urine dipstick: NEGATIVE
Ketones, ur: NEGATIVE mg/dL
Leukocytes,Ua: NEGATIVE
Nitrite: NEGATIVE
Protein, ur: NEGATIVE mg/dL
Specific Gravity, Urine: 1.018 (ref 1.005–1.030)
pH: 6 (ref 5.0–8.0)

## 2021-04-20 LAB — SALICYLATE LEVEL: Salicylate Lvl: <7 mg/dL — ABNORMAL LOW (ref 7.0–30.0)

## 2021-04-20 LAB — ACETAMINOPHEN LEVEL: Acetaminophen (Tylenol), Serum: <10 ug/mL — ABNORMAL LOW (ref 10–30)

## 2021-04-20 LAB — I-STAT BETA HCG BLOOD, ED (MC, WL, AP ONLY): I-stat hCG, quantitative: <5 m[IU]/mL (ref <5)

## 2021-04-20 LAB — ETHANOL: Alcohol, Ethyl (B): <10 mg/dL (ref <10)

## 2021-04-20 MED ORDER — LORAZEPAM 1 MG PO TABS: 1.0000 mg | ORAL_TABLET | Freq: Once | ORAL | Status: DC

## 2021-04-20 NOTE — Progress Notes (Signed)
CSW met with Pt at bedside with the assistance of [employer] interpreter # [phone].  Pt was not forthcoming and would only nod or shake head to answer yes or no questions. Pt indicated that she has a headache and there is discomfort in her chest.  Pt indicates that she lives with brothers and sisters ans that she is angry with her brothers.  She indicated that she is not in fear of her brothers.  She indicated that she is safe at home.  She indicated that she has a job that she enjoys and that she is able to purchase things that she likes.   She indicates that she is sad but declined to speak about it. She indicated that there was no single event that was the cause of sadness.  She did indicate that she would be willing to speak with someone at a later date about being sad. She indicated that she would like CSW to make an appointment for her to speak with someone regarding sadness.  CSW made followup appointment at [facility] outpatient and added information to AVS.

## 2021-04-20 NOTE — ED Triage Notes (Signed)
Pt brought in by gems from home  The pt had walked into her house then slowly sank to the floor  The family called ems  The paramedic reports that   The pt would not speak walked to the truck eytc.  On arrival to the ed no response to any questions asked makes good eye contact   She never uttered a word

## 2021-04-20 NOTE — ED Notes (Signed)
Brother at  The nedside the pt is still not speaking english  The brother does speak english

## 2021-04-20 NOTE — ED Notes (Signed)
To ct

## 2021-04-20 NOTE — Progress Notes (Addendum)
CSW spoke with patients nurse and got some background information. CSW unable to see patient in person. CSW awaiting for second shift CSW to arrive around 11 AM to speak with patient. Patient does not speak English and is not telling staff what is going on. CSW will plan to use translator to see if patient will speak.

## 2021-04-20 NOTE — ED Provider Notes (Signed)
Assumed care of patient at change of shift, see prior note for complete history and physical exam.  Briefly, patient came to the emergency room acting strangely, will indicate yes or no to questions otherwise not communicative with staff as to why she is not feeling safe.  Work-up including CT of the head and neck as well as labs has been unrevealing.  Patient is awaiting evaluation by social worker with her brother at bedside. Physical Exam  BP 134/72   Pulse 63   Temp 98.2 F (36.8 C) (Oral)   Resp 17   Ht '5\' 8"'  (1.727 m)   Wt 99.8 kg   SpO2 100%   BMI 33.45 kg/m   Physical Exam  ED Course/Procedures     Procedures  MDM  Patient was moved to a hall bed while awaiting evaluation with social work.  She continues to not communicate verbally.  2:51pm Patient has met with Education officer, museum, felt to be safe for dc, given follow up appointment with behavioral health.       Tacy Learn, PA-C 04/20/21 1451    [place], Alvin Critchley, DO 04/23/21 [phone]

## 2021-04-20 NOTE — Discharge Instructions (Addendum)
Follow up with [facility] for outpatient therapy with Marybelle Killings Monday June 27 2pm  Follow up with behavioral health appointment on 06/19/21 at [facility]. Please return to the Emergency Room for any new or worsening symptoms.   Fuatilia miadi ya afya ya kitabia tarehe 06/19/21 saa 2PM. Edmonia Lynch rudi kwenye [place] cha [place] kwa dalili zozote mpya au zinazozidi [place].
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
## 2021-04-21 ENCOUNTER — Encounter: Payer: Self-pay | Admitting: Family Medicine

## 2021-04-21 ENCOUNTER — Other Ambulatory Visit: Payer: Self-pay

## 2021-04-21 ENCOUNTER — Ambulatory Visit (INDEPENDENT_AMBULATORY_CARE_PROVIDER_SITE_OTHER): Payer: Medicaid Other | Admitting: Family Medicine

## 2021-04-21 VITALS — BP 120/82 | HR 99 | Wt 235.4 lb

## 2021-04-21 DIAGNOSIS — F430 Acute stress reaction: Secondary | ICD-10-CM | POA: Diagnosis not present

## 2021-04-21 DIAGNOSIS — D509 Iron deficiency anemia, unspecified: Secondary | ICD-10-CM

## 2021-04-21 DIAGNOSIS — N939 Abnormal uterine and vaginal bleeding, unspecified: Secondary | ICD-10-CM | POA: Diagnosis present

## 2021-04-21 LAB — POCT HEMOGLOBIN: Hemoglobin: 8.1 g/dL — AB (ref 11–14.6)

## 2021-04-21 NOTE — Assessment & Plan Note (Signed)
Repeat POC similar to prior, rescheduled iron infusion. She is amenable to keeping at this time. Not on menses at this time, denies symptoms of anemia other than fatigue. No chest pain, palpitations, syncope.
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
## 2021-04-21 NOTE — Assessment & Plan Note (Signed)
Has Gynecology follow up scheduled.

## 2021-04-21 NOTE — Patient Instructions (Addendum)
It was wonderful to see you today.  Please bring ALL of your medications with you to every visit.   Today we talked about:  Going to [facility]  [address] [place], [place] [address] [phone]  I will ask Tora Perches team to reach out to you    Thank you for choosing [facility].   Please call [phone] with any questions about today's appointment.  Terisa Starr, MD  Family Medicine

## 2021-04-21 NOTE — Telephone Encounter (Signed)
Error

## 2021-04-25 ENCOUNTER — Telehealth: Payer: Self-pay | Admitting: *Deleted

## 2021-04-25 NOTE — Chronic Care Management (AMB) (Signed)
  Care Management   Outreach Note  04/25/2021 Name: Kathy Richards MRN: [id number] DOB: 11-Aug-1991  Referred by: Primary care provider Dr. Manson Passey. Reason for referral : Care Coordination (Initial outreach to schedule referral with Licensed Clinical Social Worker)   An unsuccessful telephone outreach was attempted today using Swahili; [employer] interpreter ID# [id number] named Kathy Richards. The patient was referred to the case management team for assistance with care management and care coordination.   Follow Up Plan: The care management team will reach out to the patient again over the next 5 days.  If patient returns call to provider office, please advise to call Embedded Care Management Care Guide Kathy Richards at [phone].  Kathy Richards  Care Guide, Embedded Care Coordination [facility]

## 2021-04-27 ENCOUNTER — Telehealth: Payer: Self-pay | Admitting: *Deleted

## 2021-04-27 NOTE — Chronic Care Management (AMB) (Addendum)
  Care Management   Note  04/27/2021 Name: Ahmaya Ostermiller MRN: [id number] DOB: 06-26-1991  Kathy Richards is a 30 y.o. year old female who is a primary care patient of primary care provider Dr. Manson Passey.. I reached out to Delight Gille by phone today using [facility] interpreter ID [phone] named Ibhrim in response to a referral sent by Ms. Elli Hardge's PCP, Dr. Manson Passey.    Ms. Bluett was given information about care management services today including:  1. Care management services include personalized support from designated clinical staff supervised by her physician, including individualized plan of care and coordination with other care providers 2. 24/7 contact phone numbers for assistance for urgent and routine care needs. 3. The patient may stop care management services at any time by phone call to the office staff.  Patient agreed to services and verbal consent obtained.   Follow up plan: Telephone appointment with care management team member scheduled for:05/02/2021  [facility] Guide, Embedded Care Coordination [facility] Management

## 2021-04-28 ENCOUNTER — Other Ambulatory Visit: Payer: Self-pay

## 2021-04-28 ENCOUNTER — Ambulatory Visit (INDEPENDENT_AMBULATORY_CARE_PROVIDER_SITE_OTHER): Payer: Medicaid Other | Admitting: Family Medicine

## 2021-04-28 DIAGNOSIS — N939 Abnormal uterine and vaginal bleeding, unspecified: Secondary | ICD-10-CM | POA: Diagnosis not present

## 2021-04-28 DIAGNOSIS — F430 Acute stress reaction: Secondary | ICD-10-CM
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
## 2021-04-28 NOTE — Assessment & Plan Note (Signed)
No symptoms of anemia at this time but her hemoglobin has been in the low 8 range.  This is due to menorrhagia.  Electrophoresis was normal.  Scheduled for IV iron--- she is currently taking oral iron each day and reports compliance with this.  Scheduled with gynecology as well appreciate their consultation and care of this patient

## 2021-04-28 NOTE — Assessment & Plan Note (Signed)
Supportive listening provided today.  We discussed barriers to seeking therapy.  She would very much like to go to family services as this is near her house.  I did discuss that it is important that she have established therapy before next visit.  She will go next week she reports.

## 2021-04-28 NOTE — Patient Instructions (Addendum)
It was wonderful to see you today.  Please bring ALL of your medications with you to every visit.   Today we talked about:  --Going to get iron next Wednesday    -- Return in June to update your vaccines    --Go to [facility] before your next visit  Address: [address], [place], [place] [address] Phone: [phone]  Thank you for choosing [facility].   Please call [phone] with any questions about today's appointment.  Please be sure to schedule follow up at the front  desk before you leave today.   Terisa Starr, MD  Family Medicine

## 2021-05-02 ENCOUNTER — Ambulatory Visit: Payer: Medicaid Other | Admitting: Licensed Clinical Social Worker

## 2021-05-02 DIAGNOSIS — Z139 Encounter for screening, unspecified: Secondary | ICD-10-CM

## 2021-05-02 DIAGNOSIS — F430 Acute stress reaction: Secondary | ICD-10-CM
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
## 2021-05-02 NOTE — Patient Instructions (Signed)
Visit Information  Kathy Richards was given information about Medicaid Managed Care team care coordination services as a part of their Healthy [facility] Medicaid benefit. Kathy Richards verbally consented to engagement with the [facility] Managed Care team.   For questions related to your Healthy [facility] health plan, please call: [phone] or visit the homepage here: [email or web address]  If you would like to schedule transportation through your Healthy [facility] plan, please call the following number at least 2 days in advance of your appointment: [phone]   Call the [facility] Crisis Line at [phone], at any time, 24 hours a day, 7 days a week. If you are in danger or need immediate medical attention call 911.  Kathy Richards - following are the goals we discussed in your visit today:  Goals Addressed            This Visit's Progress   . Find Help in My Community       Timeframe:  Short-Term Goal Priority:  High Start Date:     05/02/2021                        Expected End Date:                       . Team that specializes with your insurance will contact you to provide additional information and resource . Keep all appointments we discussed today: [facility]. . I have placed a referral to help you with coordinating transportation to your medical appointments they will call you . Also remember to go to the food location we discussed [place] 1: [employer] [address], [address] Monday-Thursday 9:30am-2:30pm (closed the 2nd Wednesday of each month)  [facility] 2: [employer] [address] Every Friday [id number]: Wednesday 3pm-7pm, Friday 11am-2pm, and Saturday 9am-12pm     Why is this important?    Knowing how and where to find help for yourself or family in your neighborhood and community is an important skill.      Patient verbalizes understanding of  instructions provided today.   Patient will be referred to [facility] MM team.  [name], LCSW Care Management & Coordination  [phone]   Following is a copy of your plan of care:  Patient Care Plan: General Social Work (Adult)  Problem Identified: Coping Skills   Goal: Coping Skills Enhanced by connecting with counsling   Start Date: 05/02/2021  This Visit's Progress: On track  Priority: High  Current barriers:   . Acute Mental Health needs related to Stress reaction . Lacks knowledge of community resource: of how to connect for counseling  Needs Support, Education, and Care Coordination in order to meet unmet mental health needs. Clinical Goal(s): patient will work with CCM team to connected for ongoing counseling.  Clinical Interventions:  . Assessed patient's needs, coping skills, current treatment, support system and barriers to care  . Other interventions: Problem Solving /Task Center  ; . Provided patient with information about Congregational nurses program . Collaborated with Zachery Dauer  with Congregational nurses program re: care coordination . Discussed several options for long term counseling based on need and insurance. ( looked in chart, patient has appointment with [facility] in June. Marland Kitchen Collaboration with PCP regarding development and update of comprehensive plan of care as evidenced by provider attestation and co-signature . Inter-disciplinary care team collaboration (see longitudinal plan of care) Patient Goals/Self-Care Activities: Over  the next 30 days . Team that specializes with your insurance will contact you to provide additional information and resource   Problem Identified: Barriers to Treatment with SDOH   Goal: Barriers to Treatment / SDOH Managed   Start Date: 05/02/2021  This Visit's Progress: On track  Priority: High  Note:   Current barriers:   .  Transportation and Limited access to food Clinical Goals: Patient will  work with agencies discussed today to address needs  Clinical Interventions:  . Collaboration with No primary care provider on file. regarding development and update of comprehensive plan of care as evidenced by provider attestation and co-signature . Inter-disciplinary care team collaboration (see longitudinal plan of care) . Assessment of needs, barriers , agencies contacted, as well as how impacting  . Review various resources, discussed options and provided patient information about Department of Social Services (food stamps, [employer], and utilities assistance), Transportation provided by [employer] provider, and [employer] options  . Referral placed to connect patient with CCM Managed Medicaid Team and Congregational nurse program Patient Goals/Self-Care Activities: Over the next 30 days . I have placed a referral to help you with coordinating transportation to your medical appointments they will call you . Also remember to go to the food location we discussed [place] 1: First [address], [address] Monday-Thursday 9:30am-2:30pm (closed the 2nd Wednesday of each month)  [facility] 2: [employer] [address] Every Friday [id number]: Wednesday 3pm-7pm, Friday 11am-2pm, and Saturday 9am-12pm

## 2021-05-03 ENCOUNTER — Ambulatory Visit (HOSPITAL_COMMUNITY)
Admission: RE | Admit: 2021-05-03 | Discharge: 2021-05-03 | Disposition: A | Discharge status: Home / Self Care | Payer: Medicaid Other | Source: Ambulatory Visit | Attending: Family Medicine | Admitting: Family Medicine

## 2021-05-03 DIAGNOSIS — D509 Iron deficiency anemia, unspecified: Secondary | ICD-10-CM | POA: Diagnosis present

## 2021-05-03 MED ORDER — SODIUM CHLORIDE 0.9 % IV SOLN
510.0000 mg | Freq: Once | INTRAVENOUS | Status: AC
  Administered 2021-05-03: 510 mg via INTRAVENOUS
  Filled 2021-05-03: qty 510

## 2021-05-03 NOTE — Discharge Instructions (Signed)
Ferumoxytol injection What is this medicine? FERUMOXYTOL is an iron complex. Iron is used to make healthy red blood cells, which carry oxygen and nutrients throughout the body. This medicine is used to treat iron deficiency anemia. This medicine may be used for other purposes; ask your health care provider or pharmacist if you have questions. COMMON BRAND NAME(S): Feraheme What should I tell my health care provider before I take this medicine? They need to know if you have any of these conditions:  anemia not caused by low iron levels  high levels of iron in the blood  magnetic resonance imaging (MRI) test scheduled  an unusual or allergic reaction to iron, other medicines, foods, dyes, or preservatives  pregnant or trying to get pregnant  breast-feeding How should I use this medicine? This medicine is for injection into a vein. It is given by a health care professional in a hospital or clinic setting. Talk to your pediatrician regarding the use of this medicine in children. Special care may be needed. Overdosage: If you think you have taken too much of this medicine contact a poison control center or emergency room at once. NOTE: This medicine is only for you. Do not share this medicine with others. What if I miss a dose? It is important not to miss your dose. Call your doctor or health care professional if you are unable to keep an appointment. What may interact with this medicine? This medicine may interact with the following medications:  other iron products This list may not describe all possible interactions. Give your health care provider a list of all the medicines, herbs, non-prescription drugs, or dietary supplements you use. Also tell them if you smoke, drink alcohol, or use illegal drugs. Some items may interact with your medicine. What should I watch for while using this medicine? Visit your doctor or healthcare professional regularly. Tell your doctor or healthcare  professional if your symptoms do not start to get better or if they get worse. You may need blood work done while you are taking this medicine. You may need to follow a special diet. Talk to your doctor. Foods that contain iron include: whole grains/cereals, dried fruits, beans, or peas, leafy green vegetables, and organ meats (liver, kidney). What side effects may I notice from receiving this medicine? Side effects that you should report to your doctor or health care professional as soon as possible:  allergic reactions like skin rash, itching or hives, swelling of the face, lips, or tongue  breathing problems  changes in blood pressure  feeling faint or lightheaded, falls  fever or chills  flushing, sweating, or hot feelings  swelling of the ankles or feet Side effects that usually do not require medical attention (report to your doctor or health care professional if they continue or are bothersome):  diarrhea  headache  nausea, vomiting  stomach pain This list may not describe all possible side effects. Call your doctor for medical advice about side effects. You may report side effects to FDA at 1-800-FDA-1088. Where should I keep my medicine? This drug is given in a hospital or clinic and will not be stored at home. NOTE: This sheet is a summary. It may not cover all possible information. If you have questions about this medicine, talk to your doctor, pharmacist, or health care provider.  2021 Elsevier/Gold Standard (2017-01-28 20:21:10)  

## 2021-05-08 ENCOUNTER — Other Ambulatory Visit: Payer: Self-pay

## 2021-05-08 NOTE — Patient Instructions (Signed)
Visit Information  Ms. Kathy Richards  - as a part of your Medicaid benefit, you are eligible for care management and care coordination services at no cost or copay. I was unable to reach you by phone today but would be happy to help you with your health related needs. Please feel free to call me @ [phone].   A member of the Managed Medicaid care management team will reach out to you again over the next 7 days.   [name], BSW, [place] [facility]  [employer] Risk Managed Medicaid Team  [phone]

## 2021-05-08 NOTE — Patient Outreach (Signed)
Care Coordination  05/08/2021  Kathy Richards 1991/04/22 [id number]   Medicaid Managed Care   Unsuccessful Outreach Note  05/08/2021 Name: Kathy Richards MRN: [id number] DOB: [birth year]  Referred by: No primary care provider on file. Reason for referral : High Risk Managed Medicaid (MM Social Work Unsuccessful [employer])   An unsuccessful telephone outreach was attempted today. The patient was referred to the case management team for assistance with care management and care coordination.   Follow Up Plan: The care management team will reach out to the patient again over the next 7 days.   [name], BSW, [place] [facility]  [employer] Risk Managed Medicaid Team  [phone]

## 2021-05-10 ENCOUNTER — Telehealth: Payer: Self-pay

## 2021-05-10 ENCOUNTER — Ambulatory Visit: Payer: Medicaid Other

## 2021-05-10 ENCOUNTER — Other Ambulatory Visit: Payer: Self-pay | Admitting: Licensed Clinical Social Worker

## 2021-05-10 NOTE — Patient Instructions (Signed)
Visit Information  Kathy Richards was given information about Medicaid Managed Care team care coordination services as a part of their Healthy [facility] Medicaid benefit. Kathy Richards verbally consented to engagement with the [facility] Managed Care team.   For questions related to your Healthy [facility] health plan, please call: [phone] or visit the homepage here: [email or web address]  If you would like to schedule transportation through your Healthy [facility] plan, please call the following number at least 2 days in advance of your appointment: [phone]   Call the [facility] Crisis Line at [phone], at any time, 24 hours a day, 7 days a week. If you are in danger or need immediate medical attention call 911.  Kathy Richards - following are the goals we discussed in your visit today:  Goals Addressed            This Visit's Progress   . Track and Manage My Symptoms-Depression       Timeframe:  Long-Range Goal Priority:  High Start Date:   05/10/21                          Expected End Date:  07/10/21                  Follow Up Date 05/15/21   - avoid negative self-talk - develop a personal safety plan - develop a plan to deal with triggers like holidays, anniversaries - exercise at least 2 to 3 times per week - have a plan for how to handle bad days - journal feelings and what helps to feel better or worse - spend time or talk with others at least 2 to 3 times per week - spend time or talk with others every day - watch for early signs of feeling worse - write in journal every day    Why is this important?    Keeping track of your progress will help your treatment team find the right mix of medicine and therapy for you.   Write in your journal every day.   Day-to-day changes in depression symptoms are normal. It may be more helpful to check your progress at the end of each week instead of every day.     Notes:         [name], BSW, MSW, [employer] Managed Medicaid LCSW [facility]  [facility] [email or web address] Phone: [phone]

## 2021-05-10 NOTE — Telephone Encounter (Signed)
Contacted Ms Kathy Richards to establish care with Congregational Nurse Program. I was able to have a conversation in Swahili without interpreter. Patient is interested in coming to my office in person. She is interested in learning English and getting employed. She is aware of [employer] where [place] classes are offered. I have reminded her on tomorrows appointment with [facility]. She has transportation. I will continue to follow and assist  as needed.  [name] RN BSn PCCN  [facility]regational [facility] [phone]-cell [phone]-office

## 2021-05-10 NOTE — Patient Outreach (Signed)
Medicaid Managed Care Social Work Note  05/10/2021 Name:  Kathy Richards MRN:  [id number] DOB:  [birth year]  Kathy Richards is an 30 y.o. year old female who is a primary patient of No primary care provider on file..  The Medicaid Managed Care Coordination team was consulted for assistance with:  Community Resources  Mental Health Counseling and Resources  [name] was given information about Medicaid Managed CareCoordination services today. [name] agreed to services and verbal consent obtained.  Engaged with patient  for by telephone forinitial visit in response to referral for case management and/or care coordination services.   Assessments/Interventions:  Review of past medical history, allergies, medications, health status, including review of consultants reports, laboratory and other test data, was performed as part of comprehensive evaluation and provision of chronic care management services.  SDOH: (Social Determinant of Health) assessments and interventions performed: SDOH Interventions   Flowsheet Row Most Recent Value  SDOH Interventions   SDOH Interventions for the Following Domains Depression, Food Insecurity  Food Insecurity Interventions Other (Comment)  [Asked patient to return [facility] BSW's call. Number provided]  Depression Interventions/Treatment  Referral to Psychiatry      Advanced Directives Status:  Not addressed in this encounter.  Care Plan                 No Known Allergies  Medications Reviewed Today    Reviewed by Westley Chandler, MD (Physician) on 04/21/21 at 0929  Med List Status: <None>  Medication Order Taking? Sig Documenting Provider Last Dose Status Informant  ferrous sulfate 324 MG TBEC [id number] No Take 1 tablet (324 mg total) by mouth daily with breakfast. Westley Chandler, MD Taking Active   ferumoxytol [facility]) 510 MG/17ML SOLN injection [id number]  Inject 17 mLs (510 mg total) into the vein once for 1 dose. [name], MD   Expired 03/20/21 2359   medroxyPROGESTERone (PROVERA) 10 MG tablet [id number]  Take 1 tablet (10 mg total) by mouth daily. Westley Chandler, MD  Active   naproxen (NAPROSYN) 500 MG tablet [id number]  Take 1 tablet (500 mg total) by mouth 2 (two) times daily with a meal. Westley Chandler, MD  Active           Patient Active Problem List   Diagnosis Date Noted  . Abnormal uterine bleeding 03/20/2021  . Microcytic anemia 11/21/2020  . Obesity (BMI 30-39.9) 11/21/2020  . Stress reaction 10/17/2020  . Refugee health examination 09/20/2020    Conditions to be addressed/monitored per PCP order:  Depression  Care Plan : General Social Work (Adult)  Updates made by [name], LCSW since 05/10/2021 12:00 AM    Problem: Coping Skills     Long-Range Goal: Coping Skills Enhanced by connecting with counsling   Start Date: 05/10/2021  Expected End Date: 07/10/2021  Recent Progress: On track  Priority: High  Note:   Timeframe:  Long-Range Goal Priority:  High Start Date:   05/10/21                          Expected End Date:  07/10/21                  Follow Up Date 05/15/21  Current barriers:   . Acute Mental Health needs related to Stress reaction . Lacks knowledge of community resource: of how to connect for counseling  Needs Support, Education, and Care Coordination in order to meet unmet mental health needs.  Clinical Goal(s): patient will work with [facility] team to connected for ongoing counseling.  Clinical Interventions:  . Assessed patient's needs, coping skills, current treatment, support system and barriers to care  . Other interventions: Problem Solving /Task Center  ; . Patient reports that her stress and depression have decreased since her physical health has improved. She reports that she has a strong family support network that she relies on. She shares that she lives in a home with several family members including her siblings. She reports barriers with transportation, food  assistance and financial barriers.  . Provided patient with information about Congregational nurses program . [facility] LCSW discussed coping skills for stress management. SW used empathetic and active and reflective listening, validated patient's feelings/concerns, and provided emotional support. LCSW provided self-care examples to help patient manage their multiple health conditions and improve her mood.  Steele Sizer with Zachery Dauer with Congregational nurses program re: care coordination . Discussed several options for long term counseling based on need and insurance. ( looked in chart, patient has appointment with [facility] on 06/19/21) [facility] LCSW completed care coordination with [name], [name], and [name]. Patient was informed that she missed a call from [facility] BSW on 05/08/21 for community resource support and connection. Patient was provided [facility] BSW's contact information and is agreeable to contact her back today.  [name] care team collaboration (see longitudinal plan of care) Patient Goals/Self-Care Activities: Over the next 30 days . Team that specializes with your insurance will contact you to provide additional information and resource    Depression screen [facility] 2/9 05/10/2021 04/21/2021 01/27/2021 01/23/2021 12/09/2020  Decreased Interest 0 0 0 0 0  Down, Depressed, Hopeless 0 0 0 0 0  PHQ - 2 Score 0 0 0 0 0  Altered sleeping 0 0 0 0 -  Tired, decreased energy 1 1 0 1 -  Change in appetite 0 0 0 0 -  Feeling bad or failure about yourself  1 1 0 0 -  Trouble concentrating 0 1 0 0 -  Moving slowly or fidgety/restless 0 0 0 0 -  Suicidal thoughts 0 0 0 0 -  PHQ-9 Score 2 3 0 1 -  Difficult doing work/chores Not difficult at all - - - -    Follow up:  Patient agrees to Care Plan and Follow-up.  Plan: The Managed Medicaid care management team will reach out to the patient again over the next 14 days.  Date of next scheduled Social  Work care management/care coordination outreach:  05/15/21  [name], BSW, MSW, LCSW Managed Medicaid LCSW [facility]  [facility] [email or web address] Phone: [phone]

## 2021-05-11 ENCOUNTER — Ambulatory Visit (INDEPENDENT_AMBULATORY_CARE_PROVIDER_SITE_OTHER): Payer: Medicaid Other | Admitting: Obstetrics and Gynecology

## 2021-05-11 ENCOUNTER — Encounter: Payer: Self-pay | Admitting: Obstetrics and Gynecology

## 2021-05-11 ENCOUNTER — Other Ambulatory Visit: Payer: Self-pay

## 2021-05-11 VITALS — BP 112/64 | HR 79 | Wt 233.3 lb

## 2021-05-11 DIAGNOSIS — D500 Iron deficiency anemia secondary to blood loss (chronic): Secondary | ICD-10-CM

## 2021-05-11 DIAGNOSIS — Z789 Other specified health status: Secondary | ICD-10-CM

## 2021-05-11 DIAGNOSIS — N921 Excessive and frequent menstruation with irregular cycle: Secondary | ICD-10-CM

## 2021-05-11 DIAGNOSIS — Z3202 Encounter for pregnancy test, result negative: Secondary | ICD-10-CM | POA: Diagnosis not present

## 2021-05-11 DIAGNOSIS — N920 Excessive and frequent menstruation with regular cycle: Secondary | ICD-10-CM | POA: Diagnosis not present

## 2021-05-11 LAB — POCT PREGNANCY, URINE: Preg Test, Ur: NEGATIVE

## 2021-05-11 MED ORDER — NORETHIN ACE-ETH ESTRAD-FE 1-20 MG-MCG(24) PO CAPS: 1.0000 | ORAL_CAPSULE | Freq: Every day | ORAL | 3 refills | Status: DC

## 2021-05-11 NOTE — Progress Notes (Signed)
Obstetrics and Gynecology New Patient Evaluation  Appointment Date: 05/11/2021  OBGYN Clinic: Center for [facility]  Primary Care Provider: Pcp, No  Referring Provider: McDiarmid, Leighton Roach, MD  Chief Complaint: AUB History of Present Illness: Kathy Richards is a 30 y.o.  [id number] (Patient's last menstrual period was 04/23/2021 (exact date).), seen for the above chief complaint. Her past medical history is significant for h/o c-section x 2.   Patient has been followed by [facility] for anemia and menorrhagia. She has been tried on sprintec and ortho-cyclen and then on provera for AUB.  Patient states she last took OCPs about two weeks ago and has no current bleeding. She states that before starting OCPs for her menorrhagia that her periods were qmonth, regular, approximately one week in length, not painful but heavy. Since starting the OCPs in late 2021 she states she's had AUB and she confirms that she was taking them qday at the same time qday.  Last sex two weeks ago. She states she has never tried anything for her periods aside from pills.   She has had a normal TSH and pelvic u/s. Late April Hgb 8.1  Review of Systems: Pertinent items noted in HPI and remainder of comprehensive ROS otherwise negative.   Patient Active Problem List   Diagnosis Date Noted  . Breakthrough bleeding on birth control pills 05/11/2021  . Language barrier 05/11/2021  . Abnormal uterine bleeding 03/20/2021  . Microcytic anemia 11/21/2020  . Obesity (BMI 30-39.9) 11/21/2020  . Stress reaction 10/17/2020  . Refugee health examination 09/20/2020    Past Medical History:  Past Medical History:  Diagnosis Date  . Obesity     Past Surgical History:  Past Surgical History:  Procedure Laterality Date  . CESAREAN SECTION     x2    Past Obstetrical History:  OB History  Gravida Para Term Preterm AB Living  3 3 3  0 0 1  SAB IAB Ectopic Multiple Live Births  0 0 0 0 3     # Outcome Date GA Lbr Len/2nd Weight Sex Delivery Anes PTL Lv  3 Term         ND  2 Term           1 Term         ND    Obstetric Comments  Two children died shortly after birth-- both Cesarean deliveries     Past Gynecological History: As per HPI. History of Pap Smear(s): Yes.   Last pap 2021, which was negative  Social History:  Social History   Socioeconomic History  . Marital status: Single    Spouse name: Not on file  . Number of children: Not on file  . Years of education: Not on file  . Highest education level: Not on file  Occupational History  . Not on file  Tobacco Use  . Smoking status: Never Smoker  . Smokeless tobacco: Never Used  Substance and Sexual Activity  . Alcohol use: Not Currently  . Drug use: Never  . Sexual activity: Yes    Partners: Male    Birth control/protection: None    Comment: husband in 2022   Other Topics Concern  . Not on file  Social History Narrative   ** Merged History Encounter **       Mother is Mbiya Mulumba   Husband is still in [place]   Refugee Information Number of Immediate Family Members: 8 Number of Immediate Family Members in [place]: 7  Date of Arrival: 08/03/20 Country of Birth: Other Other Country of Birth:: [place] Count   ry of Origin: Other Other Country of Origin:: [place] Location of Refugee [place]: Other Other Location of Refugee Camp:: [place] Duration in [place]: 20 years or greater Reason for Leaving Home Country: Other Other Reason for Leaving Home Country:: born i   n the refugee camp Primary Language: Other Other Primary Language:: swahili, nyanja and understanding of english Able to Read in Primary Language: Yes (a little) Able to Write in Primary Language: Yes (a little) Education: [employer] (two years fro   m completing) Prior Work: business; Secondary school teacher and shoes Marital Status: Married Sexual Activity: No Tuberculosis Screening Overseas: Negative Tuberculosis  Screening Health Department: Not Completed Health Department Labs Co   mpleted: No History of Trauma: None Do You Feel Jumpy or Nervous?: No Are You Very Watchful or 'Super Alert'?: Yes (feels unsafe in her neighborhood.)     Social Determinants of Health   Financial Resource Strain: Medium Risk  . Difficulty of Paying Living Expenses: Somewhat hard  Food Insecurity: Food Insecurity Present  . Worried About Programme researcher, broadcasting/film/video in the Last Year: Sometimes true  . Ran Out of Food in the Last Year: Sometimes true  Transportation Needs: No Transportation Needs  . Lack of Transportation (Medical): No  . Lack of Transportation (Non-Medical): No  Physical Activity: Not on file  Stress: No Stress Concern Present  . Feeling of Stress : Only a little  Social Connections: Not on file  Intimate Partner Violence: Not At Risk  . Fear of Current or Ex-Partner: No  . Emotionally Abused: No  . Physically Abused: No  . Sexually Abused: No    Family History:  Family History  Problem Relation Age of Onset  . HIV Mother   . Hypertension Father     Medications Emrie Mallin had no medications administered during this visit. Current Outpatient Medications  Medication Sig Dispense Refill  . ferrous sulfate 324 MG TBEC Take 1 tablet (324 mg total) by mouth daily with breakfast. 90 tablet 3  . naproxen (NAPROSYN) 500 MG tablet Take 1 tablet (500 mg total) by mouth 2 (two) times daily with a meal. (Patient not taking: Reported on 05/11/2021) 30 tablet 0   No current facility-administered medications for this visit.    Allergies Patient has no known allergies.   Physical Exam:  BP 112/64   Pulse 79   Wt 233 lb 4.8 oz (105.8 kg)   LMP 04/23/2021 (Exact Date)   BMI 35.47 kg/m  Body mass index is 35.47 kg/m.  General appearance: Well nourished, well developed female in no acute distress.  Neuro/Psych:  Normal mood and affect.  Skin:  Warm and dry.   Laboratory: as per  HPI  Radiology:  Narrative & Impression  CLINICAL DATA:  Menorrhagia, last menstrual period 01/01/2021, premenopausal.  EXAM: TRANSABDOMINAL AND TRANSVAGINAL ULTRASOUND OF PELVIS  TECHNIQUE: Both transabdominal and transvaginal ultrasound examinations of the pelvis were performed. Transabdominal technique was performed for global imaging of the pelvis including uterus, ovaries, adnexal regions, and pelvic cul-de-sac. It was necessary to proceed with endovaginal exam following the transabdominal exam to visualize the endometrium.  COMPARISON:  None  FINDINGS: Uterus  Measurements: 8.3 x 4.5 x 6.9 cm = volume: 134 mL. No fibroids or other mass visualized.  Endometrium  Thickness: 76mm. Fluid noted within the endometrial canal measuring 1.2 x 0.3 x 0.9 cm.  Right ovary  Measurements: 2.5  x 1.3 x 1.9 cm = volume: 3.3 mL. Normal appearance/no adnexal mass.  Left ovary: Not visualized.  Other findings  No abnormal free fluid.  Pelvic varicosities.  IMPRESSION: 1. Trace fluid noted within the endometrium. Normal endometrial thickness. 2. The left ovary is not visualized. 3. Pelvic varices.   Electronically Signed   By: Tish Frederickson M.D.   On: 01/28/2021 01:34     Assessment: pt stable  Plan:  1. Language barrier In person interpreter used  2. Iron deficiency anemia due to chronic blood loss Followed by Family medicine  3. Breakthrough bleeding on birth control pills I told her options of Mirena IUD, depo provera, OCPs or Lysteda. Pt states she would like to do something that also works for birth control and would like to try a different type of pill first, so I told her I recommend a new type of OCP and hopefully one with less placebo days will work better. Taytulla sent in  4. Menorrhagia with regular cycle  5. GYN care Patient declines STD screening today  RTC 2-66m  Cornelia Copa MD Attending Center for [employer]  [facility])

## 2021-05-11 NOTE — Patient Instructions (Signed)
Desitin barrier cream Gold bond powder

## 2021-05-15 ENCOUNTER — Other Ambulatory Visit: Payer: Self-pay | Admitting: Licensed Clinical Social Worker

## 2021-05-15 NOTE — Patient Outreach (Addendum)
Medicaid Managed Care Social Work Note  05/15/2021 Name:  Kathy Richards MRN:  [id number] DOB:  04-13-1991  Kathy Richards is an 30 y.o. year old female who is a primary patient of Pcp, No.  The Medicaid Managed Care Coordination team was consulted for assistance with:  Mental Health Counseling and Resources  Kathy Richards was given information about Medicaid Managed CareCoordination services today. Kathy Richards agreed to services and verbal consent obtained.  Engaged with patient  for by telephone forfollow up visit in response to referral for case management and/or care coordination services.   Assessments/Interventions:  Review of past medical history, allergies, medications, health status, including review of consultants reports, laboratory and other test data, was performed as part of comprehensive evaluation and provision of chronic care management services.  SDOH: (Social Determinant of Health) assessments and interventions performed: SDOH Interventions   Flowsheet Row Most Recent Value  SDOH Interventions   Depression Interventions/Treatment  Referral to Psychiatry      Advanced Directives Status:  Not addressed in this encounter.  Care Plan                 No Known Allergies  Medications Reviewed Today    Reviewed by Isabell Jarvis, RN (Registered Nurse) on 05/11/21 at 1001  Med List Status: <None>  Medication Order Taking? Sig Documenting Provider Last Dose Status Informant  ferrous sulfate 324 MG TBEC [id number] Yes Take 1 tablet (324 mg total) by mouth daily with breakfast. Westley Chandler, MD Taking Active   medroxyPROGESTERone (PROVERA) 10 MG tablet [id number] No Take 1 tablet (10 mg total) by mouth daily.  Patient not taking: Reported on 05/11/2021   Westley Chandler, MD Not Taking Active   naproxen (NAPROSYN) 500 MG tablet [id number] No Take 1 tablet (500 mg total) by mouth 2 (two) times daily with a meal.  Patient not taking: Reported on 05/11/2021    Westley Chandler, MD Not Taking Active   norgestimate-ethinyl estradiol (ORTHO-CYCLEN) 0.25-35 MG-MCG tablet [id number] No Take 1 tablet by mouth daily.  Patient not taking: Reported on 05/11/2021   [provider] Not Taking Active           Patient Active Problem List   Diagnosis Date Noted  . Breakthrough bleeding on birth control pills 05/11/2021  . Language barrier 05/11/2021  . Abnormal uterine bleeding 03/20/2021  . Microcytic anemia 11/21/2020  . Obesity (BMI 30-39.9) 11/21/2020  . Stress reaction 10/17/2020  . Refugee health examination 09/20/2020    Conditions to be addressed/monitored per PCP order:  Anxiety and Depression  Care Plan : General Social Work (Adult)  Updates made by Kathy Bryant, Kathy Richards since 05/15/2021 12:00 AM    Problem: Coping Skills     Long-Range Goal: Coping Skills Enhanced by connecting with counsling   Start Date: 05/10/2021  Expected End Date: 07/10/2021  Recent Progress: On track  Priority: High  Note:   Timeframe:  Long-Range Goal Priority:  High Start Date:   05/10/21                          Expected End Date:  07/10/21                  Follow Up Date 05/29/21  Current barriers:   . Acute Mental Health needs related to Stress reaction . Lacks knowledge of community resource: of how to connect for counseling  Needs Support, Education, and Care Coordination in order  to meet unmet mental health needs. Clinical Goal(s): patient will work with [facility] team to connected for ongoing counseling.  Clinical Interventions:  . Assessed patient's needs, coping skills, current treatment, support system and barriers to care  . Other interventions: Problem Solving /Task Center  ; . Patient reports that her stress and depression have decreased since her physical health has improved. She reports that she has a strong family support network that she relies on. She shares that she lives in a home with several family members including her siblings. She  reports barriers with transportation, food assistance and financial barriers.  . Provided patient with information about Congregational nurses program. Patient confirms that she received a phone call from their program last week.  Marland Kitchen [facility] Kathy Richards discussed coping skills for stress management. SW used empathetic and active and reflective listening, validated patient's feelings/concerns, and provided emotional support. Kathy Richards provided self-care examples to help patient manage their multiple health conditions and improve her mood.  Steele Sizer with Zachery Dauer with Congregational nurses program re: care coordination . Discussed several options for long term counseling based on need and insurance. ( looked in chart, patient has appointment with [facility] on 06/19/21) [facility] Kathy Richards completed care coordination with [name], [name], and [name]. Patient was informed that she missed a call from [facility] BSW on 05/08/21 for community resource support and connection. Patient was provided [facility] BSW's contact information and is agreeable to contact her back. [facility] BSW appointment scheduled for 05/18/21 as well. . Patient denies needing grief therapy and states that she has not experienced a loss of a loved one. [name] care team collaboration (see longitudinal plan of care) Patient Goals/Self-Care Activities: Over the next 30 days . Team that specializes with your insurance will contact you to provide additional information and resource    Depression screen [facility] 2/9 05/15/2021 05/11/2021 05/10/2021 04/21/2021 01/27/2021  Decreased Interest 0 0 0 0 0  Down, Depressed, Hopeless 0 0 0 0 0  PHQ - 2 Score 0 0 0 0 0  Altered sleeping 0 0 0 0 0  Tired, decreased energy - 0 1 1 0  Change in appetite 0 0 0 0 0  Feeling bad or failure about yourself  0 0 1 1 0  Trouble concentrating 0 0 0 1 0  Moving slowly or fidgety/restless 0 0 0 0 0  Suicidal thoughts 0 0 0 0 0  PHQ-9 Score 0 0 2  3 0  Difficult doing work/chores Not difficult at all - Not difficult at all - -   Follow up:  Patient agrees to Care Plan and Follow-up.  Plan: The Managed Medicaid care management team will reach out to the patient again over the next 30 days.  Date of next scheduled Social Work care management/care coordination outreach:  05/29/21  [name], BSW, MSW, Kathy Richards Managed Medicaid Kathy Richards [facility]  [facility] [email or web address] Phone: [phone]

## 2021-05-15 NOTE — Patient Instructions (Signed)
Visit Information  Ms. Sloane was given information about Medicaid Managed Care team care coordination services as a part of their Healthy [facility] Medicaid benefit. Briann Phagan verbally consented to engagement with the [facility] Managed Care team.   For questions related to your Healthy [facility] health plan, please call: [phone] or visit the homepage here: [email or web address]  If you would like to schedule transportation through your Healthy [facility] plan, please call the following number at least 2 days in advance of your appointment: [phone]   Call the [facility] Crisis Line at [phone], at any time, 24 hours a day, 7 days a week. If you are in danger or need immediate medical attention call 911.  Ms. Boven - following are the goals we discussed in your visit today:  Goals Addressed            This Visit's Progress   . Track and Manage My Symptoms-Depression       Timeframe:  Long-Range Goal Priority:  High Start Date:   05/10/21                          Expected End Date:  07/10/21                  Follow Up Date : 05/29/21   - avoid negative self-talk - develop a personal safety plan - develop a plan to deal with triggers like holidays, anniversaries - exercise at least 2 to 3 times per week - have a plan for how to handle bad days - journal feelings and what helps to feel better or worse - spend time or talk with others at least 2 to 3 times per week - spend time or talk with others every day - watch for early signs of feeling worse - write in journal every day    Why is this important?    Keeping track of your progress will help your treatment team find the right mix of medicine and therapy for you.   Write in your journal every day.   Day-to-day changes in depression symptoms are normal. It may be more helpful to check your progress at the end of each week instead of every day.     Notes:         [name], BSW, MSW, [employer] Managed Medicaid LCSW [facility]  [facility] [email or web address] Phone: [phone]

## 2021-05-17 ENCOUNTER — Ambulatory Visit: Payer: Self-pay

## 2021-05-26 ENCOUNTER — Ambulatory Visit (INDEPENDENT_AMBULATORY_CARE_PROVIDER_SITE_OTHER): Payer: Medicaid Other | Admitting: Family Medicine

## 2021-05-26 ENCOUNTER — Encounter: Payer: Self-pay | Admitting: Family Medicine

## 2021-05-26 ENCOUNTER — Other Ambulatory Visit: Payer: Self-pay

## 2021-05-26 ENCOUNTER — Ambulatory Visit (INDEPENDENT_AMBULATORY_CARE_PROVIDER_SITE_OTHER): Payer: Medicaid Other

## 2021-05-26 VITALS — BP 121/66 | HR 71 | Wt 235.2 lb

## 2021-05-26 DIAGNOSIS — Z9189 Other specified personal risk factors, not elsewhere classified: Secondary | ICD-10-CM | POA: Diagnosis not present

## 2021-05-26 DIAGNOSIS — Z23 Encounter for immunization: Secondary | ICD-10-CM

## 2021-05-26 DIAGNOSIS — N939 Abnormal uterine and vaginal bleeding, unspecified: Secondary | ICD-10-CM | POA: Diagnosis not present

## 2021-05-26 DIAGNOSIS — Z659 Problem related to unspecified psychosocial circumstances: Secondary | ICD-10-CM | POA: Diagnosis not present

## 2021-05-26 DIAGNOSIS — D509 Iron deficiency anemia, unspecified: Secondary | ICD-10-CM

## 2021-05-26 DIAGNOSIS — Z30013 Encounter for initial prescription of injectable contraceptive: Secondary | ICD-10-CM | POA: Diagnosis not present

## 2021-05-26 LAB — POCT HEMOGLOBIN: Hemoglobin: 9.1 g/dL — AB (ref 11–14.6)

## 2021-05-26 LAB — POCT URINE PREGNANCY: Preg Test, Ur: NEGATIVE

## 2021-05-26 MED ORDER — MEDROXYPROGESTERONE ACETATE 150 MG/ML IM SUSY
150.0000 mg | PREFILLED_SYRINGE | Freq: Once | INTRAMUSCULAR | Status: AC
  Administered 2021-05-26: 150 mg via INTRAMUSCULAR

## 2021-05-26 NOTE — Assessment & Plan Note (Signed)
Repeat hemoglobin is improved today.  Recommended continuing oral iron supplementation.  Will discuss again at follow-up.

## 2021-05-26 NOTE — Progress Notes (Signed)
   Covid-19 Vaccination Clinic  Name:  Kathy Richards    MRN: [id number] DOB: August 26, 1991  05/26/2021  Kathy Richards was observed post Covid-19 immunization for 15 minutes without incident. She was provided with Vaccine Information Sheet and instruction to access the V-Safe system.   Kathy Richards was instructed to call 911 with any severe reactions post vaccine: Marland Kitchen Difficulty breathing  . Swelling of face and throat  . A fast heartbeat  . A bad rash all over body  . Dizziness and weakness

## 2021-05-26 NOTE — Assessment & Plan Note (Signed)
There appears to be considerable confusion about her medications.  She is only taking the combined hormonal therapy during any periods.  She does not want to take any pills the rest of the month.  After lengthy discussion she is agreeable to Depo.  I discussed that this could cause irregular menses including menorrhagia and weight gain.  She would like to try this as contraceptive method at this time.

## 2021-05-26 NOTE — Progress Notes (Signed)
    SUBJECTIVE:   CHIEF COMPLAINT / HPI:  The patient speaks Swahili as their primary language.  An interpreter was used for the entire visit.   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 She reports she was seen by Dr. Vergie Living, prescribed a new COC. She tried this and found no improvement in symptoms. Denies fatigue, heavy bleeding. She did receive her iron transfusion. She is due for vaccines for her [id number]. She is varicella immune.   The patient reports she last had intercourse several months ago. She is interested in an injectable contraceptive. She does not wish to take pills. Not interested in IUD or Nexplanon at this time.   She reports her and her family are doing well.  She is still not talked with her husband.  She feels safe at home.  She denies thoughts of hurting herself or others.  She does report some stressors related to her Medicaid.  She reports she is receiving bills at home. PERTINENT  PMH / PSH/Family/Social History : IDA, improved today Adjustment disorder  OBJECTIVE:   BP 121/66   Pulse 71   Wt 235 lb 3.2 oz (106.7 kg)   SpO2 100%   BMI 35.76 kg/m   Today's weight:  Last Weight  Most recent update: 05/26/2021 10:08 AM   Weight  106.7 kg (235 lb 3.2 oz)           Review of prior weights:  Cardiac: Warm well perfused.  Capillary refill less than 3 seconds Respiratory breathing comfortably on room air Psych: Pleasant normal affect, appropriate, normal rate of speech    ASSESSMENT/PLAN:   Abnormal uterine bleeding There appears to be considerable confusion about her medications.  She is only taking the combined hormonal therapy during any periods.  She does not want to take any pills the rest of the month.  After lengthy discussion she is agreeable to Depo.  I discussed that this could cause irregular menses including menorrhagia and weight gain.  She would like to try this as contraceptive method at this  time.  Microcytic anemia Repeat hemoglobin is improved today.  Recommended continuing oral iron supplementation.  Will discuss again at follow-up.   Financial strain patient agreeable to chronic care management referral to aid in insurance status and Medicaid.  She does not have a Medicaid card but per report from the front her Medicaid is active.  HCM Pot[place], requires QuantiFERON gold prior to and 693 completion obtained today Updated vaccines including COVID and hepatitis A, she is immune to varicella    Terisa Starr, MD  Family Medicine Teaching Service  [facility]

## 2021-05-26 NOTE — Patient Instructions (Signed)
It was wonderful to see you today.  Please bring ALL of your medications with you to every visit.   Today we talked about: --A shot to prevent pregnancy  -- A check of your blood for the green card  -- Follow up in 2 months     Thank you for choosing [facility].   Please call [phone] with any questions about today's appointment.  Please be sure to schedule follow up at the front  desk before you leave today.   Terisa Starr, MD  Family Medicine

## 2021-05-29 ENCOUNTER — Ambulatory Visit: Payer: Medicaid Other | Admitting: Licensed Clinical Social Worker

## 2021-05-29 ENCOUNTER — Other Ambulatory Visit: Payer: Self-pay | Admitting: Licensed Clinical Social Worker

## 2021-05-29 DIAGNOSIS — F321 Major depressive disorder, single episode, moderate: Secondary | ICD-10-CM

## 2021-05-29 NOTE — Patient Outreach (Signed)
Medicaid Managed Care Social Work Note  05/29/2021 Name:  Kathy Richards MRN:  [id number] DOB:  02-03-1991  Kathy Richards is an 30 y.o. year old female who is a primary patient of Kathy Chandler, MD.  The Medicaid Managed Care Coordination team was consulted for assistance with:  Transportation Needs  Community Resources  Mental Richards Counseling and Resources  Kathy Richards was given information about Medicaid Managed CareCoordination services today. Kathy Richards agreed to services and verbal consent obtained.  Engaged with patient  for by telephone forfollow up visit in response to referral for case management and/or care coordination services.   Assessments/Interventions:  Review of past medical history, allergies, medications, Richards status, including review of consultants reports, laboratory and other test data, was performed as part of comprehensive evaluation and provision of chronic care management services.  SDOH: (Social Determinant of Richards) assessments and interventions performed:   Advanced Directives Status:  Not addressed in this encounter.  Care Plan                 No Known Allergies  Medications Reviewed Today    Reviewed by Kathy Chandler, MD (Physician) on 05/26/21 at 1458  Med List Status: <None>  Medication Order Taking? Sig Documenting Provider Last Dose Status Informant  ferrous sulfate 324 MG TBEC [id number] No Take 1 tablet (324 mg total) by mouth daily with breakfast.  Patient not taking: Reported on 05/26/2021   Kathy Chandler, MD Not Taking Active   naproxen (NAPROSYN) 500 MG tablet [id number] No Take 1 tablet (500 mg total) by mouth 2 (two) times daily with a meal.  Patient not taking: No sig reported   Kathy Chandler, MD Not Taking Consider Medication Status and Discontinue   Norethin Ace-Eth Estrad-FE (TAYTULLA) 1-20 MG-MCG(24) CAPS [id number] Yes Take 1 tablet by mouth daily. Kathy West Bing, MD Taking Active           Patient Active  Problem List   Diagnosis Date Noted  . Language barrier 05/11/2021  . Abnormal uterine bleeding 03/20/2021  . Microcytic anemia 11/21/2020  . Obesity (BMI 30-39.9) 11/21/2020  . Refugee Richards examination 09/20/2020    Conditions to be addressed/monitored per PCP order:  Anxiety and Depression  Care Plan : General Social Work (Adult)  Updates made by Kathy Bryant, LCSW since 05/29/2021 12:00 AM    Problem: Coping Skills     Long-Range Goal: Coping Skills Enhanced by connecting with counsling   Start Date: 05/10/2021  Expected End Date: 07/10/2021  Recent Progress: On track  Priority: High  Note:   Timeframe:  Long-Range Goal Priority:  High Start Date:   05/10/21                          Expected End Date:  07/10/21                  Follow Up Date 06/09/21  Current barriers:   . Acute Mental Richards needs related to Stress reaction . Lacks knowledge of community resource: of how to connect for counseling  Needs Support, Education, and Care Coordination in order to meet unmet mental Richards needs. Clinical Goal(s): patient will work with Kathy Richards team to connected for ongoing counseling.  Clinical Interventions:  . Assessed patient's needs, coping skills, current treatment, support system and barriers to care  . Other interventions: Problem Solving /Task Richards  ; . Patient reports that her stress and depression have decreased since her  physical Richards has improved. She reports that she has a strong family support network that she relies on. She shares that she lives in a home with several family members including her siblings. She reports barriers with transportation, food assistance and financial barriers.  . Provided patient with information about Kathy Richards. Patient confirms that she received a phone call from their Richards last week.  [facility] LCSW discussed coping skills for stress management. SW used empathetic and active and reflective listening, validated  patient's feelings/concerns, and provided emotional support. LCSW provided self-care examples to help patient manage their multiple Richards conditions and improve her mood.  Kathy Richards with Kathy Richards with Kathy Richards re: care coordination . Discussed several options for long term counseling based on need and insurance. ( looked in chart, patient has appointment with Kathy Richards on 06/19/21) [facility] LCSW completed care coordination with Kathy Richards, Kathy Richards, and Kathy Richards on 05/08/21. [facility] LCSW completed call to Kathy Richards and was informed that her counseling appointment is in person. Kathy Richards [facility] LCSW also made a referral for medication management there as patient is interested in both counseling and psychiatry. Patient reports having no transportation to her appointment on 06/19/21 which is in person. [facility] LCSW will send update to Kathy Richards Kathy Richards to assist.  . Patient denies needing grief therapy and states that she has not experienced a loss of a loved one. Kathy Richards care team collaboration (see longitudinal plan of care) Patient Goals/Self-Care Activities: Over the next 30 days . Team that specializes with your insurance will contact you to provide additional information and resource     Follow up:  Patient agrees to Care Plan and Follow-up.  Plan: The Managed Medicaid care management team will reach out to the patient again over the next 30 days.  Date of next scheduled Social Work care management/care coordination outreach:06/09/21.   Kathy Richards, Kathy Richards, Kathy Richards, Kathy Richards  [email or web address] Phone: [phone]

## 2021-05-29 NOTE — Patient Instructions (Signed)
Visit Information  Kathy Richards was given information about Medicaid Managed Care team care coordination services as a part of their Healthy [facility] Medicaid benefit. Kathy Richards verbally consented to engagement with the [facility] Managed Care team.   For questions related to your Healthy [facility] health plan, please call: [phone] or visit the homepage here: [email or web address]  If you would like to schedule transportation through your Healthy [facility] plan, please call the following number at least 2 days in advance of your appointment: [phone]   Call the [facility] Crisis Line at [phone], at any time, 24 hours a day, 7 days a week. If you are in danger or need immediate medical attention call 911.  [name] - following are the goals we discussed in your visit today:  Goals Addressed            This Visit's Progress   . Track and Manage My Symptoms-Depression       Timeframe:  Long-Range Goal Priority:  High Start Date:   05/10/21                          Expected End Date:  07/10/21                  Follow Up Date : 06/09/21   - avoid negative self-talk - develop a personal safety plan - develop a plan to deal with triggers like holidays, anniversaries - exercise at least 2 to 3 times per week - have a plan for how to handle bad days - journal feelings and what helps to feel better or worse - spend time or talk with others at least 2 to 3 times per week - spend time or talk with others every day - watch for early signs of feeling worse - write in journal every day    Why is this important?    Keeping track of your progress will help your treatment team find the right mix of medicine and therapy for you.   Write in your journal every day.   Day-to-day changes in depression symptoms are normal. It may be more helpful to check your progress at the end of each week instead of every day.     Notes:        [name], BSW, MSW, [employer] Managed Medicaid LCSW [facility]  [facility] [email or web address] Phone: [phone]

## 2021-05-30 LAB — QUANTIFERON-TB GOLD PLUS
QuantiFERON Mitogen Value: >10 IU/mL
QuantiFERON Nil Value: 0.87 IU/mL
QuantiFERON TB1 Ag Value: >10 IU/mL
QuantiFERON TB2 Ag Value: >10 IU/mL
QuantiFERON-TB Gold Plus: POSITIVE — AB

## 2021-05-31 ENCOUNTER — Telehealth: Payer: Self-pay | Admitting: Family Medicine

## 2021-05-31 DIAGNOSIS — Z227 Latent tuberculosis: Secondary | ICD-10-CM

## 2021-05-31 NOTE — Telephone Encounter (Signed)
Called with Swahili interpreter.  Quantiferon gold returned positive. Recommended chest x-ray. Patient adamant she still does not want treatment. Will refer to HD, placed on Tb list.  Terisa Starr, MD  [facility] Medicine Teaching Service

## 2021-06-05 ENCOUNTER — Telehealth: Payer: Self-pay

## 2021-06-05 NOTE — Telephone Encounter (Signed)
   Telephone encounter was:  Successful.  06/05/2021 Name: Kathy Richards MRN: [id number] DOB: Mar 30, 1991  Kathy Richards is a 30 y.o. year old female who is a primary care patient of Westley Chandler, MD . The community resource team was consulted for assistance with  Healthy [facility] Card  Care guide performed the following interventions: Spoke to patient with the assistance of Teacher, music. Asked patient if she had received Medicaid Healthy Blue card yet and she stated she has not.  I asked her if she needed my assistance calling Medicaid since they have to speak to the client to verify address and mail the card. Patient stated she did not need my assistance calling gave patient the number [phone].  Explained that Medicaid will provide interpreter service for her. She stated she does not need further assistance at this time..  Follow Up Plan:  No further follow up planned at this time. The patient has been provided with needed resources.  Kathy Richards, AAS Paralegal, [facility] Care Guide  Embedded Care Coordination [place]  Care Management  [address] [place], [place] [address] [email or web address]  ?? [phone]   [email or web address]

## 2021-06-09 ENCOUNTER — Other Ambulatory Visit: Payer: Self-pay | Admitting: Licensed Clinical Social Worker

## 2021-06-09 NOTE — Patient Instructions (Signed)
Visit Information  Kathy Richards was given information about Medicaid Managed Care team care coordination services as a part of their Healthy [facility] Medicaid benefit. Kathy Richards verbally consented to engagement with the [facility] Managed Care team.   For questions related to your Healthy [facility] health plan, please call: [phone] or visit the homepage here: [email or web address]  If you would like to schedule transportation through your Healthy [facility] plan, please call the following number at least 2 days in advance of your appointment: [phone]   Call the [facility] Crisis Line at [phone], at any time, 24 hours a day, 7 days a week. If you are in danger or need immediate medical attention call 911.  Ms. Mihalko - following are the goals we discussed in your visit today:   Goals Addressed             This Visit's Progress    Track and Manage My Symptoms-Depression       Timeframe:  Long-Range Goal Priority:  High Start Date:   05/10/21                          Expected End Date:  07/10/21                  Follow Up Date : 06/28/21   - avoid negative self-talk - develop a personal safety plan - develop a plan to deal with triggers like holidays, anniversaries - exercise at least 2 to 3 times per week - have a plan for how to handle bad days - journal feelings and what helps to feel better or worse - spend time or talk with others at least 2 to 3 times per week - spend time or talk with others every day - watch for early signs of feeling worse - write in journal every day    Why is this important?   Keeping track of your progress will help your treatment team find the right mix of medicine and therapy for you.  Write in your journal every day.  Day-to-day changes in depression symptoms are normal. It may be more helpful to check your progress at the end of each week instead of every day.     Notes:          [name], BSW, MSW, [employer] Managed Medicaid LCSW [facility]  [facility] [email or web address] Phone: [phone]

## 2021-06-09 NOTE — Patient Outreach (Signed)
Medicaid Managed Care Social Work Note  06/09/2021 Name:  Kathy Richards MRN:  [id number] DOB:  Dec 30, 1990  Kathy Richards is an 30 y.o. year old female who is a primary patient of Westley Chandler, MD.  The Medicaid Managed Care Coordination team was consulted for assistance with:  Mental Health Counseling and Resources  Ms. Laws was given information about Medicaid Managed CareCoordination services today. Jocelynn Wojnarowski agreed to services and verbal consent obtained.  Engaged with patient  for by telephone forfollow up visit in response to referral for case management and/or care coordination services.   Assessments/Interventions:  Review of past medical history, allergies, medications, health status, including review of consultants reports, laboratory and other test data, was performed as part of comprehensive evaluation and provision of chronic care management services.  SDOH: (Social Determinant of Health) assessments and interventions performed: SDOH Interventions    Flowsheet Row Most Recent Value  SDOH Interventions   Depression Interventions/Treatment  Referral to Psychiatry       Advanced Directives Status:  Not addressed in this encounter.  Care Plan                 No Known Allergies  Medications Reviewed Today     Reviewed by Westley Chandler, MD (Physician) on 05/26/21 at 1458  Med List Status: <None>   Medication Order Taking? Sig Documenting Provider Last Dose Status Informant  ferrous sulfate 324 MG TBEC [id number] No Take 1 tablet (324 mg total) by mouth daily with breakfast.  Patient not taking: Reported on 05/26/2021   Westley Chandler, MD Not Taking Active   naproxen (NAPROSYN) 500 MG tablet [id number] No Take 1 tablet (500 mg total) by mouth 2 (two) times daily with a meal.  Patient not taking: No sig reported   Westley Chandler, MD Not Taking Consider Medication Status and Discontinue   Norethin Ace-Eth Estrad-FE (TAYTULLA) 1-20 MG-MCG(24) CAPS  [id number] Yes Take 1 tablet by mouth daily. 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   Language barrier 05/11/2021   Abnormal uterine bleeding 03/20/2021   Microcytic anemia 11/21/2020   Obesity (BMI 30-39.9) 11/21/2020   Refugee health examination 09/20/2020    Conditions to be addressed/monitored per PCP order:  Anxiety and Depression  Care Plan : General Social Work (Adult)  Updates made by [name], LCSW since 06/09/2021 12:00 AM     Problem: Coping Skills      Long-Range Goal: Coping Skills Enhanced by connecting with counsling   Start Date: 05/10/2021  Expected End Date: 07/10/2021  Recent Progress: On track  Priority: High  Note:   Timeframe:  Long-Range Goal Priority:  High Start Date:   05/10/21                          Expected End Date:  07/10/21                  Follow Up Date 06/28/21  Current barriers:   Acute Mental Health needs related to Stress reaction Lacks knowledge of community resource: of how to connect for counseling Needs Support, Education, and Care Coordination in order to meet unmet mental health needs. Clinical Goal(s): patient will work with [facility] team to connected for ongoing counseling.  Clinical Interventions:  Assessed patient's needs, coping skills, current treatment, support  system and barriers to care  Other interventions: Problem Solving /Task Center  ; Patient reports that her stress and depression have decreased since her physical health has improved. She reports that she has a strong family support network that she relies on. She shares that she lives in a home with several family members including her siblings. She reports barriers with transportation, food assistance and financial barriers.  Provided patient with information about [employer] program. Patient confirms that she received a phone call from their program last week.  [facility]  LCSW discussed coping skills for stress management. SW used empathetic and active and reflective listening, validated patient's feelings/concerns, and provided emotional support. LCSW provided self-care examples to help patient manage their multiple health conditions and improve her mood.  Collaborated with [employer] with [employer] program re: care coordination Discussed several options for long term counseling based on need and insurance. ( looked in chart, patient has appointment with [facility] on 06/19/21 and 06/13/21) [facility] LCSW completed care coordination with [name], [name], and [name] on 05/08/21. [facility] LCSW completed call to [facility] and was informed that her counseling appointment is in person. [facility] LCSW also made a referral for medication management there as patient is interested in both counseling and psychiatry. Patient reports having no transportation to her appointment on 06/13/21 and 06/19/21 which is in person. [facility] LCSW will send update to [facility] BSW to assist. Update- Patient reports that her brother will be transporting her to her mental health appointments. Patient was sent a text message with a reminder of these appointments: medication management on 06/13/21 and counseling on 06/19/21.  Patient was encouraged to contact [phone] to ask about getting her healthy blue Medicaid card that she still hasn't received in the mail yet.  Patient denies needing grief therapy and states that she has not experienced a loss of a loved one. Inter-disciplinary care team collaboration (see longitudinal plan of care) Patient Goals/Self-Care Activities: Over the next 30 days Team that specializes with your insurance will contact you to provide additional information and resource     Follow up:  Patient agrees to Care Plan and Follow-up.  Plan: The Managed Medicaid care management team will reach out to the patient  again over the next 30 days.  Date of next scheduled Social Work care management/care coordination outreach:  06/28/21  [name], BSW, MSW, LCSW Managed Medicaid LCSW [facility]  [facility] [email or web address] Phone: [phone]

## 2021-06-13 ENCOUNTER — Ambulatory Visit (HOSPITAL_COMMUNITY): Payer: Self-pay | Admitting: Psychiatry

## 2021-06-16 ENCOUNTER — Ambulatory Visit: Payer: Medicaid Other

## 2021-06-19 ENCOUNTER — Ambulatory Visit (HOSPITAL_COMMUNITY): Payer: Self-pay | Admitting: Licensed Clinical Social Worker

## 2021-06-28 ENCOUNTER — Other Ambulatory Visit: Payer: Self-pay | Admitting: Licensed Clinical Social Worker

## 2021-06-28 NOTE — Patient Instructions (Signed)
Visit Information  Kathy Richards was given information about Medicaid Managed Care team care coordination services as a part of their Healthy [facility] Medicaid benefit. Kathy Richards verbally consented to engagement with the [facility] Managed Care team.   For questions related to your Healthy [facility] health plan, please call: [phone] or visit the homepage here: [email or web address]  If you would like to schedule transportation through your Healthy [facility] plan, please call the following number at least 2 days in advance of your appointment: [phone]  Call the [facility] Crisis Line at [phone], at any time, 24 hours a day, 7 days a week. If you are in danger or need immediate medical attention call 911.  If you would like help to quit smoking, call 1-800-QUIT-NOW ((463)875-9649) OR Espaol: 1-855-Djelo-Ya (7-001-749-4496) o para ms informacin haga clic aqu or Text READY to 759-163 to register via text  Ms. Real - following are the goals we discussed in your visit today:   Goals Addressed             This Visit's Progress    Track and Manage My Symptoms-Depression       Timeframe:  Long-Range Goal Priority:  High Start Date:   05/10/21                          Expected End Date:  ongoing                 Follow Up Date : 07/12/21  Patient was a no show for her counseling appointment and canceled her medication management appointment at [facility] because she had no stable source of transportation.    - avoid negative self-talk - develop a personal safety plan - develop a plan to deal with triggers like holidays, anniversaries - exercise at least 2 to 3 times per week - have a plan for how to handle bad days - journal feelings and what helps to feel better or worse - spend time or talk with others at least 2 to 3 times per week - spend time or talk with others every day - watch  for early signs of feeling worse - write in journal every day    Why is this important?   Keeping track of your progress will help your treatment team find the right mix of medicine and therapy for you.  Write in your journal every day.  Day-to-day changes in depression symptoms are normal. It may be more helpful to check your progress at the end of each week instead of every day.     Notes:          [name], BSW, MSW, [employer] Managed Medicaid LCSW [facility]  [facility] [email or web address] Phone: [phone]

## 2021-06-28 NOTE — Patient Outreach (Signed)
Medicaid Managed Care Social Work Note  06/28/2021 Name:  Kathy Richards MRN:  [id number] DOB:  June 19, 1991  Kathy Richards is an 30 y.o. year old female who is a primary Kathy Richards of Kathy Chandler, MD.  The Medicaid Managed Care Coordination team was consulted for assistance with:  Mental Health Counseling and Resources  Kathy Richards was given information about Medicaid Managed CareCoordination services today. Kathy Richards agreed to services and verbal consent obtained.  Engaged with Kathy Richards  for by telephone forfollow up visit in response to referral for case management and/or care coordination services.   Assessments/Interventions:  Review of past medical history, allergies, medications, health status, including review of consultants reports, laboratory and other test data, was performed as part of comprehensive evaluation and provision of chronic care management services.  SDOH: (Social Determinant of Health) assessments and interventions performed: SDOH Interventions    Flowsheet Row Most Recent Value  SDOH Interventions   Depression Interventions/Treatment  Referral to Psychiatry       Advanced Directives Status:  See Care Plan for related entries.  Care Plan                 No Known Allergies  Medications Reviewed Today     Reviewed by Kathy Chandler, MD (Physician) on 05/26/21 at 1458  Med List Status: <None>   Medication Order Taking? Sig Documenting Provider Last Dose Status Informant  ferrous sulfate 324 MG TBEC [id number] No Take 1 tablet (324 mg total) by mouth daily with breakfast.  Kathy Richards not taking: Reported on 05/26/2021   Kathy Chandler, MD Not Taking Active   naproxen (NAPROSYN) 500 MG tablet [id number] No Take 1 tablet (500 mg total) by mouth 2 (two) times daily with a meal.  Kathy Richards not taking: No sig reported   Kathy Chandler, MD Not Taking Consider Medication Status and Discontinue   Norethin Ace-Eth Estrad-FE (TAYTULLA) 1-20 MG-MCG(24) CAPS  [id number] Yes Take 1 tablet by mouth daily. 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   Language barrier 05/11/2021   Abnormal uterine bleeding 03/20/2021   Microcytic anemia 11/21/2020   Obesity (BMI 30-39.9) 11/21/2020   Refugee health examination 09/20/2020    Conditions to be addressed/monitored per PCP order:  Anxiety and Depression  Care Plan : General Social Work (Adult)  Updates made by Kathy Richards since 06/28/2021 12:00 AM     Problem: Coping Skills      Long-Range Goal: Coping Skills Enhanced by connecting with counsling   Start Date: 05/10/2021  Recent Progress: On track  Priority: High  Note:   Timeframe:  Long-Range Goal Priority:  High Start Date:   05/10/21                          Expected End Date:  ongoing  Follow Up Date 07/12/21  Current barriers:   Acute Mental Health needs related to Stress reaction Lacks knowledge of community resource: of how to connect for counseling Needs Support, Education, and Care Coordination in order to meet unmet mental health needs. No transportation  Clinical Goal(s): Over the next 120 days, Kathy Richards will work with Kathy Richards to reduce signs of depression and anxiety. Kathy Richards will work with Kathy Richards in order to get connected to needed mental health resources.   Clinical Interventions:  Assessed  Kathy Richards's needs, coping skills, current treatment, support system and barriers to care  Other interventions: Problem Solving /Task Richards  ; Kathy Richards reports that her stress and depression have decreased since her physical health has improved. She reports that she has a strong family support network that she relies on. She shares that she lives in a home with several family members including her siblings. She reports barriers with transportation, food assistance and financial barriers.  Provided Kathy Richards with information about Kathy  nurses Richards. Kathy Richards confirms that she received a phone call from their Richards. Contact information provided to Kathy Richards.  Kathy Richards discussed coping skills for stress management. SW used empathetic and active and reflective listening, validated Kathy Richards's feelings/concerns, and provided emotional support. Kathy Richards provided self-care examples to help Kathy Richards manage their multiple health conditions and improve her mood.  Collaborated with Kathy Richards with Kathy Richards re: care coordination Discussed several options for long term counseling based on need and insurance. ( looked in chart, Kathy Richards has appointment with [facility] on 06[facility]19/21 and 06/13/21) [facility] Richards completed care coordination with Kathy Richards, Kathy Richards, and Kathy Richards on 05/08/21. Kathy Richards completed call to Kathy Richards and was informed that her counseling appointment is in person. Kathy Richards also made a referral for medication management there as Kathy Richards is interested in both counseling and psychiatry. Kathy Richards reports having no transportation to her appointment on 06/13/21 and 06/19/21 which is in person. [facility] Kathy Richards will send update to Kathy Richards to assist. Update- Kathy Richards reports that her brother will be transporting her to her mental health appointments. Kathy Richards was sent a text message with a reminder of these appointments: medication management on 06/13/21 and counseling on 06/19/21. Update 06/28/21- Kathy Richards's brother did not take Kathy Richards to her appointments. Kathy Richards was a no show for her counseling appointment and canceled her medication management appointment at Kathy Richards because she had no stable source of transportation. Kathy Richards updated Kathy Richards regarding this transportation barrier. Kathy Richards was encouraged to contact Kathy Richards to reschedule appointments. Kathy Richards was made aware that she will have to be the one that  reschedules her mental health appointments. Text reminder was sent to her asking her to contact [facility] at [phone] to reschedule both appointments.  Kathy Richards was encouraged to contact [phone] to ask about getting her healthy blue Medicaid card that she still hasn't received in the mail yet.  Kathy Richards denies needing grief therapy and states that she has not experienced a loss of a loved one. Inter-disciplinary care team collaboration (see longitudinal plan of care) Kathy Richards Goals/Self-Care Activities: Over the next 30 days Team that specializes with your insurance will contact you to provide additional information and resource     Follow up:  Kathy Richards agrees to Care Plan and Follow-up.  Plan: The Managed Medicaid care management team will reach out to the Kathy Richards again over the next 30 days.  Date/time of next scheduled Social Work care management/care coordination outreach:  07/12/21 at 11:00 am  [name], Richards, MSW, [employer] Managed Medicaid [facility]  [employer] [employer] [email or web address] Phone: [phone]

## 2021-07-05 ENCOUNTER — Ambulatory Visit (INDEPENDENT_AMBULATORY_CARE_PROVIDER_SITE_OTHER): Payer: Medicaid Other

## 2021-07-05 ENCOUNTER — Other Ambulatory Visit: Payer: Self-pay

## 2021-07-05 DIAGNOSIS — Z23 Encounter for immunization: Secondary | ICD-10-CM | POA: Diagnosis present

## 2021-07-11 ENCOUNTER — Ambulatory Visit: Payer: Medicaid Other | Admitting: Family Medicine

## 2021-07-12 ENCOUNTER — Telehealth: Payer: Self-pay | Admitting: Licensed Clinical Social Worker

## 2021-07-12 ENCOUNTER — Ambulatory Visit: Payer: Self-pay

## 2021-07-12 NOTE — Patient Outreach (Signed)
  [facility]) Care Management  [facility] Care Manager  07/12/2021   Bridie Dreier 1991/03/12 [id number]   Encounter Medications:  Outpatient Encounter Medications as of 07/12/2021  Medication Sig   ferrous sulfate 324 MG TBEC Take 1 tablet (324 mg total) by mouth daily with breakfast. (Patient not taking: Reported on 05/26/2021)   No facility-administered encounter medications on file as of 07/12/2021.    Functional Status:  No flowsheet data found.  Fall/Depression Screening: Fall Risk  04/21/2021 01/23/2021 11/21/2020  Falls in the past year? 0 0 0  Number falls in past yr: 0 0 0  Injury with Fall? 0 0 0   PHQ 2/9 Scores 05/15/2021 05/11/2021 05/10/2021 04/21/2021 01/27/2021 01/23/2021 12/09/2020  PHQ - 2 Score 0 0 0 0 0 0 0  PHQ- 9 Score 0 0 2 3 0 1 -    LCSW completed [facility] outreach attempt today but was unable to reach patient successfully. A HIPPA compliant voice message was unable to be left encouraging patient to return call. LCSW will reschedule patient's [facility] Social Work appointment if no return call has been made.  [name], BSW, MSW, [employer] Managed Medicaid LCSW [facility]  [facility] [email or web address] Phone: [phone]   Plan:  Follow-up: Follow-up in 2-3 week(s)

## 2021-07-12 NOTE — Patient Instructions (Signed)
Kathy Richards ,   The Medicaid Managed Care Team is available to provide assistance to you with your healthcare needs at no cost and as a benefit of your [facility] plan. Please reach out to me at the number below. I am available to be of assistance to you regarding your healthcare needs. .   Thank you,   [name], BSW, MSW, LCSW Managed Medicaid LCSW [facility]  [address] [email or web address] Phone: [phone]

## 2021-07-27 ENCOUNTER — Ambulatory Visit: Payer: Self-pay

## 2021-09-08 ENCOUNTER — Encounter: Payer: Self-pay | Admitting: Family Medicine

## 2021-09-08 ENCOUNTER — Other Ambulatory Visit: Payer: Self-pay

## 2021-09-08 ENCOUNTER — Ambulatory Visit: Payer: Medicaid Other | Admitting: Family Medicine

## 2021-09-08 VITALS — BP 123/65 | HR 80 | Wt 234.4 lb

## 2021-09-08 DIAGNOSIS — R002 Palpitations: Secondary | ICD-10-CM | POA: Diagnosis not present

## 2021-09-08 DIAGNOSIS — N939 Abnormal uterine and vaginal bleeding, unspecified: Secondary | ICD-10-CM | POA: Diagnosis not present

## 2021-09-08 DIAGNOSIS — Z309 Encounter for contraceptive management, unspecified: Secondary | ICD-10-CM

## 2021-09-08 LAB — POCT HEMOGLOBIN: Hemoglobin: 9.8 g/dL — AB (ref 11–14.6)

## 2021-09-08 LAB — POCT URINE PREGNANCY: Preg Test, Ur: NEGATIVE

## 2021-09-08 MED ORDER — MEDROXYPROGESTERONE ACETATE 150 MG/ML IM SUSY
150.0000 mg | PREFILLED_SYRINGE | Freq: Once | INTRAMUSCULAR | Status: AC
  Administered 2021-09-08: 150 mg via INTRAMUSCULAR

## 2021-09-08 NOTE — Patient Instructions (Addendum)
It was wonderful to see you today.  Please bring ALL of your medications with you to every visit.   Today we talked about:  - Checking labs for fatigue  - Getting a Depo to prevent pregnancy  - I will call you with a swahili interpreter   - Lyndee Leo kwa uchovu  - [place] Depo ili [place] mimba  - [place] na mkalimani wa Kisw[place]ili   Thank you for choosing [facility].   Please call [phone] with any questions about today's appointment.  Please be sure to schedule follow up at the front  desk before you leave today.   Terisa Starr, MD  Family Medicine

## 2021-09-08 NOTE — Progress Notes (Signed)
    SUBJECTIVE:   CHIEF COMPLAINT: due for Depo, fatigue HPI:   Kathy Richards is a 30 y.o. yo with history notable for irregular menses presenting for breast tenderness and fatigue. Reports ~2 weeks of fatigue. LMP August 10, 2021. She last received Depo in June and reports some light spotting. Does not mind missing menses. Last sexually active several years ago. Reports menses lighter on Depo. Denies fevers, pharyngitis, new sexual partner, new stressor, unsafe home situation, galactorrhea. Reports she works form 2 PM to 11 PM. Goes to be at 2 AM. Wakes up at 5:30 AM to get [place] on bus.  She is not interested flu shot.   PERTINENT  PMH / PSH/Family/Social History : updated   OBJECTIVE:   BP 123/65   Pulse 80   Wt 234 lb 6.4 oz (106.3 kg)   SpO2 100%   BMI 35.64 kg/m   Today's weight:  Last Weight  Most recent update: 09/08/2021 11:00 AM    Weight  106.3 kg (234 lb 6.4 oz)            Review of prior weights: Filed Weights   09/08/21 1100  Weight: 234 lb 6.4 oz (106.3 kg)     Cardiac: Regular rate and rhythm. Normal S1/S2. No murmurs, rubs, or gallops appreciated. Lungs: Clear bilaterally to ascultation.  Abdomen: Normoactive bowel sounds. No tenderness to deep or light palpation. No rebound or guarding.   Psych: Pleasant and appropriate    ASSESSMENT/PLAN:   Fatigue and intermittent breast tenderness, she is late for Depo,suspect starting menses. POC pregnancy negative. Depo given. Will check TSH, ferritin, CBC. I discussed that symptoms of fatigue likely related to lack of sleep. Offered resources, supportive listening.   Contraception given Depo today. Reminder to self to schedule in 2 months.  HCM declined flu    Terisa Starr, MD  Family Medicine Teaching Service  [facility]

## 2021-09-09 LAB — CBC
Hematocrit: 33.8 % — ABNORMAL LOW (ref 34.0–46.6)
Hemoglobin: 9.9 g/dL — ABNORMAL LOW (ref 11.1–15.9)
MCH: 20.2 pg — ABNORMAL LOW (ref 26.6–33.0)
MCHC: 29.3 g/dL — ABNORMAL LOW (ref 31.5–35.7)
MCV: 69 fL — ABNORMAL LOW (ref 79–97)
Platelets: 208 10*3/uL (ref 150–450)
RBC: 4.9 x10E6/uL (ref 3.77–5.28)
RDW: 18.9 % — ABNORMAL HIGH (ref 11.7–15.4)
WBC: 4 10*3/uL (ref 3.4–10.8)

## 2021-09-09 LAB — TSH: TSH: 2.4 u[IU]/mL (ref 0.450–4.500)

## 2021-09-09 LAB — FERRITIN: Ferritin: 12 ng/mL — ABNORMAL LOW (ref 15–150)

## 2021-09-13 ENCOUNTER — Telehealth: Payer: Self-pay | Admitting: Family Medicine

## 2021-09-13 DIAGNOSIS — E611 Iron deficiency: Secondary | ICD-10-CM

## 2021-09-13 MED ORDER — FERROUS SULFATE 324 MG PO TBEC: 324.0000 mg | DELAYED_RELEASE_TABLET | Freq: Every day | ORAL | 3 refills | Status: DC

## 2021-09-13 NOTE — Telephone Encounter (Signed)
Called patient with Swahili interpreter about results.   Hemoglobin stable, ferritin still low. Should continue ferrous sulfate. New Rx to preferred pharmacy---[facility] [address].   Terisa Starr, MD  Family Medicine Teaching Service

## 2021-09-29 ENCOUNTER — Encounter: Payer: Self-pay | Admitting: Family Medicine

## 2021-09-29 ENCOUNTER — Ambulatory Visit (INDEPENDENT_AMBULATORY_CARE_PROVIDER_SITE_OTHER): Payer: Medicaid Other | Admitting: Family Medicine

## 2021-09-29 ENCOUNTER — Other Ambulatory Visit: Payer: Self-pay

## 2021-09-29 DIAGNOSIS — Z23 Encounter for immunization: Secondary | ICD-10-CM | POA: Diagnosis not present

## 2021-09-29 NOTE — Patient Instructions (Signed)
It was wonderful to see you today.  Today we talked about:  ---Getting the flu shot    Thank you for choosing [facility].   Please call [phone] with any questions about today's appointment.  Please be sure to schedule follow up at the front  desk before you leave today.   Terisa Starr, MD  Family Medicine

## 2021-09-29 NOTE — Progress Notes (Signed)
    Patient presented today with her daughter.  She reports she is in need of her flu shot for her legal permanent residence status.  Of otherwise she has no complaints and is doing well.  She reports her mood is better and she would just like her vaccine today.  Influenza shot administered.   Terisa Starr, MD  Family Medicine Teaching Service  [facility]

## 2021-11-30 NOTE — Progress Notes (Signed)
    SUBJECTIVE:   CHIEF COMPLAINT / HPI:   Heavy uterine bleeding. Patient receives Depo-Provera.  Last received on 09/08/2021.  Today she wishes to hold off as she has been bleeding since October almost every day. She states she goes through 3 pads per day since October. She states she feels more tired lately but has been taking the iron regualrly. She is interested in OCPs to help reduce her periods. She has no history of migraines and no smoking history.    PERTINENT  PMH / PSH: History of abnormal uterine bleeding  OBJECTIVE:   BP 129/74   Pulse 81   Wt 245 lb 3.2 oz (111.2 kg)   SpO2 100%   BMI 37.28 kg/m    General: NAD, pleasant, able to participate in exam Cardiac: RRR, no murmurs. Respiratory: CTAB, normal effort, No wheezes, rales or rhonchi Extremities: no edema or cyanosis. Skin: warm and dry, no rashes noted Neuro: alert, no obvious focal deficits Psych: Normal affect and mood  ASSESSMENT/PLAN:   Heavy uterine bleeding: States she is bleeding since October and going through about 3 pads per day.  She continues on her iron supplements.  She continues on Depo-Provera and will be due for another injection in about a week.  She is interested in doing OCPs to try to improve her menstrual bleeding.  I did discuss that she can continue the Depo-Provera with this or switch to OCPs entirely, with the downside of the latter being that it may not be as effective.  She prefers to switch to OCPs entirely and states that she will follow back up with [place] once her periods become more regular.  No need for pregnancy test at this time as she is still on Depo-Provera which would be repeated next week if she were continuing with it.  She understands that she is got a high risk of becoming pregnant if she does not take these on a regular and consistent basis.  We will follow-up in 2 months.  We will check CBC and TSH.  She did have a TSH checked a couple of months ago but her heavy bleeding she  states worsened since then and so we will recheck.  Jackelyn Poling, DO [facility]

## 2021-12-01 ENCOUNTER — Other Ambulatory Visit: Payer: Self-pay

## 2021-12-01 ENCOUNTER — Ambulatory Visit (INDEPENDENT_AMBULATORY_CARE_PROVIDER_SITE_OTHER): Payer: Medicaid Other | Admitting: Family Medicine

## 2021-12-01 VITALS — BP 129/74 | HR 81 | Wt 245.2 lb

## 2021-12-01 DIAGNOSIS — N939 Abnormal uterine and vaginal bleeding, unspecified: Secondary | ICD-10-CM | POA: Diagnosis present

## 2021-12-01 MED ORDER — DROSPIRENONE-ETHINYL ESTRADIOL 3-0.03 MG PO TABS: 1.0000 | ORAL_TABLET | Freq: Every day | ORAL | 11 refills | Status: DC

## 2021-12-01 NOTE — Patient Instructions (Signed)
  We are going to start a birth control pill to take each day which should help improve your periods.  We will check some lab work today and I will let you know the results when it returns.  If you want to continue with the Depo-Provera we can do this to provide extra protection against pregnancy.  I expect the birth control pills to take about 2 months to be effective in improving your periods.

## 2021-12-02 LAB — CBC
Hematocrit: 32.5 % — ABNORMAL LOW (ref 34.0–46.6)
Hemoglobin: 10.2 g/dL — ABNORMAL LOW (ref 11.1–15.9)
MCH: 21.5 pg — ABNORMAL LOW (ref 26.6–33.0)
MCHC: 31.4 g/dL — ABNORMAL LOW (ref 31.5–35.7)
MCV: 69 fL — ABNORMAL LOW (ref 79–97)
Platelets: 196 10*3/uL (ref 150–450)
RBC: 4.74 x10E6/uL (ref 3.77–5.28)
RDW: 15.6 % — ABNORMAL HIGH (ref 11.7–15.4)
WBC: 4.3 10*3/uL (ref 3.4–10.8)

## 2021-12-02 LAB — TSH: TSH: 3.05 u[IU]/mL (ref 0.450–4.500)

## 2021-12-04 ENCOUNTER — Encounter: Payer: Self-pay | Admitting: Family Medicine

## 2022-01-16 ENCOUNTER — Ambulatory Visit: Payer: Medicaid Other | Admitting: Family Medicine

## 2022-01-16 NOTE — Progress Notes (Deleted)
° ° °  SUBJECTIVE:   CHIEF COMPLAINT / HPI:   Amenorrhea: 31 year old female presenting with the above.  Current medications include Yasmin birth control which she started last month. She states***.  PERTINENT  PMH / PSH: None  OBJECTIVE:   There were no vitals taken for this visit. ***  General: NAD, pleasant, able to participate in exam Respiratory: No respiratory distress Skin: warm and dry, no rashes noted Psych: Normal affect and mood  ASSESSMENT/PLAN:   No problem-specific Assessment & Plan notes found for this encounter.   Urine pregnancy test***.  Kathy Richards, [place]    {    This will disappear when note is signed, click to select method of visit    :1}

## 2022-01-18 ENCOUNTER — Other Ambulatory Visit: Payer: Self-pay

## 2022-01-18 ENCOUNTER — Ambulatory Visit (INDEPENDENT_AMBULATORY_CARE_PROVIDER_SITE_OTHER): Payer: Medicaid Other | Admitting: Family Medicine

## 2022-01-18 VITALS — BP 126/69 | HR 80 | Ht 68.0 in | Wt 251.2 lb

## 2022-01-18 DIAGNOSIS — N926 Irregular menstruation, unspecified: Secondary | ICD-10-CM

## 2022-01-18 LAB — POCT URINE PREGNANCY: Preg Test, Ur: NEGATIVE

## 2022-01-18 NOTE — Progress Notes (Signed)
° ° °  SUBJECTIVE:   CHIEF COMPLAINT / HPI: missed period  Currently on birth control.  Reports takes daily and has not missed dose.  Skipped period in Dec but has since had period.  Reports having nausea, breast tenderness.  Took 2 pregnancy tests at home.  First pregnancy test negative, second one positive.  Denies any weight gain.  PERTINENT  PMH / PSH:  Language barrier  OBJECTIVE:   BP 126/69    Pulse 80    Ht 5\' 8"  (1.727 m)    Wt 251 lb 3.2 oz (113.9 kg)    LMP 11/30/2021 (Exact Date)    SpO2 99%    BMI 38.19 kg/m    General: Alert, no acute distress Cardio: Normal S1 and S2, RRR, no r/m/g Pulm: CTAB, normal work of breathing Abdomen: Bowel sounds normal. Abdomen soft and non-tender.    ASSESSMENT/PLAN:   Missed period One missed period while on combination OCP.  Reports compliancy with medications. UPT negative.  Viewed home pregnancy tests and negative.  Patient adamant that the second test is positive. -Quant bHcg today -Would recommend to switch to monophasic OCP -Follow up with PCP next week to review results     14/07/2021, MD [facility]

## 2022-01-18 NOTE — Patient Instructions (Addendum)
Thank you for coming to see me today. It was a pleasure. Today we talked about:   Your pregnancy test in negative. Will confirm with blood work.  Encourage weight loss management.  Please follow-up with PCP if any more missed periods and consider switching to different birth control method  If you have any questions or concerns, please do not hesitate to call the office at [phone].  Best,   Carollee Leitz, MD

## 2022-01-19 LAB — BETA HCG QUANT (REF LAB): hCG Quant: <1 m[IU]/mL

## 2022-01-21 ENCOUNTER — Encounter: Payer: Self-pay | Admitting: Family Medicine

## 2022-01-21 DIAGNOSIS — N926 Irregular menstruation, unspecified: Secondary | ICD-10-CM | POA: Insufficient documentation

## 2022-01-21 DIAGNOSIS — N912 Amenorrhea, unspecified: Secondary | ICD-10-CM | POA: Insufficient documentation

## 2022-01-21 NOTE — Assessment & Plan Note (Signed)
One missed period while on combination OCP.  Reports compliancy with medications. UPT negative.  Viewed home pregnancy tests and negative.  Patient adamant that the second test is positive. -Quant bHcg today -Would recommend to switch to monophasic OCP -Follow up with PCP next week to review results

## 2022-01-29 ENCOUNTER — Telehealth: Payer: Self-pay | Admitting: Family Medicine

## 2022-01-29 NOTE — Telephone Encounter (Signed)
Patient came in stating that she had lab work done at her appt on 01/18/22, and she hasn't heard anything about the results. She would like someone to give her a call if possible

## 2022-01-29 NOTE — Telephone Encounter (Signed)
Called patient via [employer] to discuss results.  No answer.  LVM to let patient know that a letter had been sent in the mail on 01/29 with results.  Number to clinic left to return call.  Will attempt again tomorrow.  Dana Allan, MD Family Medicine Residency

## 2022-01-31 ENCOUNTER — Ambulatory Visit: Payer: Medicaid Other | Admitting: Student

## 2022-02-16 ENCOUNTER — Encounter: Payer: Self-pay | Admitting: Family Medicine

## 2022-02-16 ENCOUNTER — Ambulatory Visit: Payer: Medicaid Other | Admitting: Family Medicine

## 2022-02-16 ENCOUNTER — Other Ambulatory Visit: Payer: Self-pay

## 2022-02-16 VITALS — BP 135/73 | HR 78 | Wt 253.4 lb

## 2022-02-16 DIAGNOSIS — D509 Iron deficiency anemia, unspecified: Secondary | ICD-10-CM

## 2022-02-16 DIAGNOSIS — Z30011 Encounter for initial prescription of contraceptive pills: Secondary | ICD-10-CM

## 2022-02-16 DIAGNOSIS — N912 Amenorrhea, unspecified: Secondary | ICD-10-CM

## 2022-02-16 LAB — POCT URINE PREGNANCY: Preg Test, Ur: NEGATIVE

## 2022-02-16 MED ORDER — NORGESTIMATE-ETH ESTRADIOL 0.25-35 MG-MCG PO TABS: 1.0000 | ORAL_TABLET | Freq: Every day | ORAL | 11 refills | Status: DC

## 2022-02-16 NOTE — Progress Notes (Signed)
SUBJECTIVE:   CHIEF COMPLAINT: amenorrhea  HPI:   Kathy Richards is a 31 y.o.  with history notable for obesity and acute stress reaction presenting for pregnancy test.   The patient speaks Swahili as their primary language.  An interpreter was used for the entire visit.   The patient's last period was in December.  She reports she had positive home pregnancy test on January 22.  She then had a negative test here.  She does bring the home test with her today.  She does indeed have a single positive pregnancy test in her pack of multiple tests.  The remainder are negative.  It.  She was slightly confused as to how to interpret these.  She reports that at night sometimes she feels on the moving her stomach and is sure it is an infant.  She would like her abdomen ultrasounded today.  She is sexually active with 1 partner they do not use protection.  She reports neither of them want a child currently.  She denies severe headaches, galactorrhea, hair changes.  She does endorse unintentional weight gain and some bloating.   PERTINENT  PMH / PSH/Family/Social History : history of trauma, lives with daughter, husband overseas still Has had stress and anxiety since moving to [place]   OBJECTIVE:   BP 135/73    Pulse 78    Wt 253 lb 6.4 oz (114.9 kg)    LMP 12/02/2021 (Exact Date)    SpO2 100%    BMI 38.53 kg/m   Today's weight:  Last Weight  Most recent update: 02/16/2022 11:45 AM    Weight  114.9 kg (253 lb 6.4 oz)            Review of prior weights: Filed Weights   02/16/22 1144  Weight: 253 lb 6.4 oz (114.9 kg)    Cardiac: Regular rate and rhythm. Normal S1/S2. No murmurs, rubs, or gallops appreciated. Lungs: Clear bilaterally to ascultation.  Abdomen: Normoactive bowel sounds. No tenderness to deep or light palpation. No rebound or guarding.  There are no masses the uterus is not gravid. Psych: Pleasant and appropriate    ASSESSMENT/PLAN:   Amenorrhea Reassured after  explaining how to interpret home pregnancy test today.  Negative pregnancy test here.  She has been amenorrheic for 3 months at this point and thus we will check a TSH and prolactin.  She is also due for repeat CBC and ferritin given her history of microcytic anemia.  Canceled Yazmin given thromboembolic events associated with this.  We will start Ortho-Cyclen.  This was sent to pharmacy she can start this medication today.  She does not need to wait for period.  All questions answered she is to follow-up in 2 months.  She is to use backup contraception for 2 weeks can take a repeat pregnancy test in 2 weeks. Differentials for cause for amenorrhea includes new onset PCOS, less likely but also considered thyroid disorder hyperprolactinemia.  As such we will obtain a TSH and prolactin as above. I do of some slight concern that she may have pseudocyesis.  She reports she does not want to become pregnant. Her and her family do value fertility.  She reports it would be important for her future that she be able to become pregnant.  Her current partner is her boyfriend.  Her husband lives overseas.  She has 1 child already.  She reports again she does not want to become pregnant today. We also discussed how that weight  gain can contribute to change in hormones and thus the change in cycles.  She has had ongoing weight gain since moving to the [place].  Discussed increasing activity and reducing high calorie foods. Declined STI testing today   Follow-up in 2 months, discussed COVID at that time.  Consider repeat A1c at follow-up.     Dorris Singh, [place]

## 2022-02-16 NOTE — Assessment & Plan Note (Signed)
Reassured after explaining how to interpret home pregnancy test today.  Negative pregnancy test here.  She has been amenorrheic for 3 months at this point and thus we will check a TSH and prolactin.  She is also due for repeat CBC and ferritin given her history of microcytic anemia.  Canceled Yazmin given thromboembolic events associated with this.  We will start Ortho-Cyclen.  This was sent to pharmacy she can start this medication today.  She does not need to wait for period.  All questions answered she is to follow-up in 2 months.

## 2022-02-16 NOTE — Patient Instructions (Signed)
It was wonderful to see you today.  Please bring ALL of your medications with you to every visit.   Today we talked about:  --You are not pregnant  -- You can start your birth control today  --I will call you with blood work results     --Zara Chess mimba  -- Tomasa Rand udhibiti wako wa [place] leo  --Nitakuita na matokeo ya kazi ya damu   Thank you for choosing [facility].   Please call [phone] with any questions about today's appointment.  Please be sure to schedule follow up at the front  desk before you leave today.   Terisa Starr, MD  Family Medicine

## 2022-02-17 LAB — CBC
Hematocrit: 36.5 % (ref 34.0–46.6)
Hemoglobin: 11.3 g/dL (ref 11.1–15.9)
MCH: 22 pg — ABNORMAL LOW (ref 26.6–33.0)
MCHC: 31 g/dL — ABNORMAL LOW (ref 31.5–35.7)
MCV: 71 fL — ABNORMAL LOW (ref 79–97)
Platelets: 199 10*3/uL (ref 150–450)
RBC: 5.13 x10E6/uL (ref 3.77–5.28)
RDW: 16.6 % — ABNORMAL HIGH (ref 11.7–15.4)
WBC: 4.5 10*3/uL (ref 3.4–10.8)

## 2022-02-17 LAB — TSH: TSH: 2.08 u[IU]/mL (ref 0.450–4.500)

## 2022-02-17 LAB — FERRITIN: Ferritin: 18 ng/mL (ref 15–150)

## 2022-02-17 LAB — PROLACTIN: Prolactin: 9.9 ng/mL (ref 4.8–23.3)

## 2022-02-19 ENCOUNTER — Telehealth: Payer: Self-pay | Admitting: Family Medicine

## 2022-02-19 ENCOUNTER — Encounter: Payer: Self-pay | Admitting: Family Medicine

## 2022-02-19 DIAGNOSIS — E611 Iron deficiency: Secondary | ICD-10-CM

## 2022-02-19 MED ORDER — FERROUS SULFATE 324 MG PO TBEC: 324.0000 mg | DELAYED_RELEASE_TABLET | Freq: Every day | ORAL | 3 refills | Status: DC

## 2022-02-19 NOTE — Telephone Encounter (Signed)
Called patient--unable to leave voicemail. Will send letter-should continue oral iron.Called with interpreter. VM full. Terisa Starr, MD  Family Medicine Teaching Service

## 2022-03-06 ENCOUNTER — Other Ambulatory Visit: Payer: Self-pay

## 2022-03-06 ENCOUNTER — Ambulatory Visit (HOSPITAL_COMMUNITY)
Admission: EM | Admit: 2022-03-06 | Discharge: 2022-03-06 | Disposition: A | Discharge status: Home / Self Care | Payer: Medicaid Other | Attending: Emergency Medicine | Admitting: Emergency Medicine

## 2022-03-06 ENCOUNTER — Encounter (HOSPITAL_COMMUNITY): Payer: Self-pay

## 2022-03-06 DIAGNOSIS — B372 Candidiasis of skin and nail: Secondary | ICD-10-CM

## 2022-03-06 DIAGNOSIS — Z3202 Encounter for pregnancy test, result negative: Secondary | ICD-10-CM | POA: Diagnosis not present

## 2022-03-06 LAB — POC URINE PREG, ED: Preg Test, Ur: NEGATIVE

## 2022-03-06 MED ORDER — NYSTATIN 100000 UNIT/GM EX POWD: 1.0000 "application " | Freq: Two times a day (BID) | CUTANEOUS | 2 refills | Status: DC

## 2022-03-06 NOTE — ED Provider Notes (Signed)
?[facility] ? ? ? ?CSN: [id number] ?Arrival date & time: 03/06/22  1146 ? ? ?  ? ?History   ?Chief Complaint ?Chief Complaint  ?Patient presents with  ? Rash  ? ? ?HPI ?Kathy Richards is a 31 y.o. female.  ? ?Patient presents with rash to abdominal fold occurring intermittently since C-section in 2021.  Endorses the site is itchy and has intermittent pain.  Site is nondraining.  Has never attempted treatment of symptoms.  Denies fever or chills. ? ?Patient requesting pregnancy test.  Endorses that she has not had a menstruation since December 2022.  Sexually active, 1 partner, condom use.  Endorses that she occasionally gets a sensation that there is fetal movement.  ? ?Past Medical History:  ?Diagnosis Date  ? Microcytic anemia 11/21/2020  ? Obesity   ? ? ?Patient Active Problem List  ? Diagnosis Date Noted  ? Amenorrhea 01/21/2022  ? 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 Language barrier 05/11/2021  ? Obesity (BMI 30-39.9) 11/21/2020  ? Refugee health examination 09/20/2020  ? ? ?Past Surgical History:  ?Procedure Laterality Date  ? CESAREAN SECTION    ? x2  ? ? ?OB History   ? ? Gravida  ?3  ? Para  ?3  ? Term  ?3  ? Preterm  ?0  ? AB  ?0  ? Living  ?1  ?  ? ? SAB  ?0  ? IAB  ?0  ? Ectopic  ?0  ? Multiple  ?0  ? Live Births  ?3  ?   ?  ? Obstetric Comments  ?Two children died shortly after birth-- both Cesarean deliveries   ?  ? ?  ? ? ? ?Home Medications   ? ?Prior to Admission medications   ?Medication Sig Start Date End Date Taking? Authorizing Provider  ?ferrous sulfate 324 MG TBEC Take 1 tablet (324 mg total) by mouth daily with breakfast. 02/19/22   Westley Chandler, MD  ?norgestimate-ethinyl estradiol (ORTHO-CYCLEN, 28,) 0.25-35 MG-MCG tablet Take 1 tablet by mouth daily. 02/16/22   Westley Chandler, MD  ? ? ?Family History ?Family History  ?Problem Relation Age of Onset  ? HIV Mother   ? Hypertension Father   ? ? ?Social History ?Social History  ? ?Tobacco Use  ? Smoking status: Never  ? Smokeless  tobacco: Never  ?Substance Use Topics  ? Alcohol use: Not Currently  ? Drug use: Never  ? ? ? ?Allergies   ?Patient has no known allergies. ? ? ?Review of Systems ?Review of Systems  ?Constitutional: Negative.   ?Respiratory: Negative.    ?Cardiovascular: Negative.   ?Skin:  Positive for rash. Negative for color change, pallor and wound.  ?Neurological: Negative.   ? ? ?Physical Exam ?Triage Vital Signs ?ED Triage Vitals  ?Enc Vitals Group  ?   BP 03/06/22 1256 121/69  ?   Pulse Rate 03/06/22 1256 71  ?   Resp 03/06/22 1256 18  ?   Temp 03/06/22 1256 97.9 ?F (36.6 ?C)  ?   Temp Source 03/06/22 1256 Oral  ?   SpO2 03/06/22 1256 98 %  ?   Weight --   ?   Height --   ?   Head Circumference --   ?   Peak Flow --   ?   Pain Score 03/06/22 1257 2  ?   Pain Loc --   ?   Pain Edu? --   ?   Excl. in GC? --   ? ?  No data found. ? ?Updated Vital Signs ?BP 121/69 (BP Location: Left Arm)   Pulse 71   Temp 97.9 ?F (36.6 ?C) (Oral)   Resp 18   LMP 01/31/2022   SpO2 98%  ? ?Visual Acuity ?Right Eye Distance:   ?Left Eye Distance:   ?Bilateral Distance:   ? ?Right Eye Near:   ?Left Eye Near:    ?Bilateral Near:    ? ?Physical Exam ?Constitutional:   ?   Appearance: Normal appearance.  ?HENT:  ?   Head: Normocephalic.  ?Eyes:  ?   Extraocular Movements: Extraocular movements intact.  ?Pulmonary:  ?   Effort: Pulmonary effort is normal.  ?Abdominal:  ?   General: Abdomen is flat.  ?   Palpations: Abdomen is soft.  ?Skin: ?   Comments: Moisture associated damage underneath the abdominal fold with scant [name] curd-like drainage present  ?Neurological:  ?   Mental Status: She is alert and oriented to person, place, and time. Mental status is at baseline.  ?Psychiatric:     ?   Mood and Affect: Mood normal.     ?   Behavior: Behavior normal.  ? ? ? ?UC Treatments / Results  ?Labs ?(all labs ordered are listed, but only abnormal results are displayed) ?Labs Reviewed - No data to display ? ?EKG ? ? ?Radiology ?No results  found. ? ?Procedures ?Procedures (including critical care time) ? ?Medications Ordered in UC ?Medications - No data to display ? ?Initial Impression / Assessment and Plan / UC Course  ?I have reviewed the triage vital signs and the nursing notes. ? ?Pertinent labs & imaging results that were available during my care of the patient were reviewed by me and considered in my medical decision making (see chart for details). ? ?Yeast infection of the skin ?Negative pregnancy test ? ?Etiology is symptoms are most likely a yeast dermatitis related to the moist dark environment of the abdominal fold, discussed with patient, prescribed nystatin powder twice a day, advised patient to keep area dry throughout the day by caring around-the-clock the 19th, may follow-up with urgent care or primary doctor if symptoms persist, urine pregnancy test negative ?Final Clinical Impressions(s) / UC Diagnoses  ? ?Final diagnoses:  ?None  ? ?Discharge Instructions   ?None ?  ? ?ED Prescriptions   ?None ?  ? ?PDMP not reviewed this encounter. ?  ?Valinda Hoar, NP ?03/06/22 1440 ? ?

## 2022-03-06 NOTE — ED Triage Notes (Signed)
Pt presents today with a itchy rash to RLQ. Pt states it has been going on for over a year. Pt denies taken any medication. Rash is also under abdomen. ?

## 2022-03-06 NOTE — Discharge Instructions (Addendum)
The area underneath your abdomen appears to be caused by a yeast infection, yeast is a fungus that thrives and warm dark moist environments which is why is present under your abdominal fold ? ?Apply powder twice daily after normal hygiene, do this until area has cleared, once rash has cleared apply powder for an additional week before stopping ? ?Throughout the day attempt to keep area dry by patting with a cloth or napkin which ever is available, this will help the rash to clear sooner ? ?If symptoms continue to persist you may follow-up with your primary doctor for reevaluation ? ?Your urine pregnancy test today is negative ?

## 2022-03-24 ENCOUNTER — Ambulatory Visit (HOSPITAL_COMMUNITY)
Admission: EM | Admit: 2022-03-24 | Discharge: 2022-03-24 | Disposition: A | Discharge status: Home / Self Care | Payer: Medicaid Other | Attending: Urgent Care | Admitting: Urgent Care

## 2022-03-24 ENCOUNTER — Encounter (HOSPITAL_COMMUNITY): Payer: Self-pay | Admitting: Emergency Medicine

## 2022-03-24 DIAGNOSIS — D649 Anemia, unspecified: Secondary | ICD-10-CM | POA: Diagnosis not present

## 2022-03-24 DIAGNOSIS — G8929 Other chronic pain: Secondary | ICD-10-CM

## 2022-03-24 DIAGNOSIS — R102 Pelvic and perineal pain: Secondary | ICD-10-CM | POA: Diagnosis not present

## 2022-03-24 LAB — POCT URINALYSIS DIPSTICK, ED / UC
Bilirubin Urine: NEGATIVE
Glucose, UA: NEGATIVE mg/dL
Hgb urine dipstick: NEGATIVE
Ketones, ur: NEGATIVE mg/dL
Leukocytes,Ua: NEGATIVE
Nitrite: NEGATIVE
Protein, ur: NEGATIVE mg/dL
Specific Gravity, Urine: 1.02 (ref 1.005–1.030)
Urobilinogen, UA: 0.2 mg/dL (ref 0.0–1.0)
pH: 6 (ref 5.0–8.0)

## 2022-03-24 LAB — POC URINE PREG, ED: Preg Test, Ur: NEGATIVE

## 2022-03-24 MED ORDER — MEFENAMIC ACID 250 MG PO CAPS: ORAL_CAPSULE | ORAL | 0 refills | Status: DC

## 2022-03-24 NOTE — Discharge Instructions (Addendum)
Your symptoms are concerning for possibly endometriosis, or possibly adhesions from your previous c-section.  ?Please follow up with your PCP to obtain a referral to gynecology. ?You can start mefenamic acid today, which is an antiinflammatory medication that helps with pelvic pain.  ?Also, given your history of anemia with continued GI symptoms, referral to GI may be warranted for further evaluation. ?If symptoms should persist or worsen, please head to the ER for further workup. ?

## 2022-03-24 NOTE — ED Provider Notes (Signed)
?[facility] ? ? ? ?CSN: [id number] ?Arrival date & time: 03/24/22  1157 ? ? ?  ? ?History   ?Chief Complaint ?Chief Complaint  ?Patient presents with  ? Abdominal Pain  ? ? ?HPI ?Kathy Richards is a 31 y.o. female.  ? ?31 year old female presents today with concerns of chronic pelvic pain.  She states she has had pain in the location of her C-section scar since 2021.  She states she had the surgery in [place].  She believes her ovaries and tubes are still in place.  She reports the pain has been there pretty consistently for almost the past 2 years, but reports over the past 1 to 2 days, the pain has increased.  She reports it is sharp in nature.  She has been having abnormal uterine bleeding for the past several months, this has been worked up by her PCP.  She also reports gas like bloating pain.  She states this has been present for months as well, but seems to be getting worse.  She denies heartburn or belching today.  She states her last normal bowel movement was yesterday.  She does not feel constipated.  She has had no vomiting or diarrhea.  She reports heavy menstrual periods that have caused anemia, and also reports a history of abnormal menses.  She is uncertain if she has endometriosis.  She has not taken anything over-the-counter for her current discomfort.  She denies fever.  She denies change in appetite and states she is still eating appropriately. She denies hematochezia or melena, no hx of GIB. ? ? ?Abdominal Pain ?Associated symptoms: no constipation, no diarrhea, no nausea and no vomiting   ? ?Past Medical History:  ?Diagnosis Date  ? Microcytic anemia 11/21/2020  ? Obesity   ? ? ?Patient Active Problem List  ? Diagnosis Date Noted  ? Amenorrhea 01/21/2022  ? 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 Language barrier 05/11/2021  ? Obesity (BMI 30-39.9) 11/21/2020  ? Refugee health examination 09/20/2020  ? ? ?Past Surgical History:  ?Procedure Laterality Date  ? CESAREAN SECTION    ? x2  ? ? ?OB  History   ? ? Gravida  ?3  ? Para  ?3  ? Term  ?3  ? Preterm  ?0  ? AB  ?0  ? Living  ?1  ?  ? ? SAB  ?0  ? IAB  ?0  ? Ectopic  ?0  ? Multiple  ?0  ? Live Births  ?3  ?   ?  ? Obstetric Comments  ?Two children died shortly after birth-- both Cesarean deliveries   ?  ? ?  ? ? ? ?Home Medications   ? ?Prior to Admission medications   ?Medication Sig Start Date End Date Taking? Authorizing Provider  ?Mefenamic Acid 250 MG CAPS Take 2 capsules for your first dose, then 1 capsule every 6 hours as needed for up to 7 days consecutively.  Stop if any GI upset 03/24/22  Yes Mendell Bontempo L, PA  ? ? ?Family History ?Family History  ?Problem Relation Age of Onset  ? HIV Mother   ? Hypertension Father   ? ? ?Social History ?Social History  ? ?Tobacco Use  ? Smoking status: Never  ? Smokeless tobacco: Never  ?Substance Use Topics  ? Alcohol use: Not Currently  ? Drug use: Never  ? ? ? ?Allergies   ?Patient has no known allergies. ? ? ?Review of Systems ?Review of Systems  ?Gastrointestinal:  Positive for abdominal pain ("gas"). Negative for abdominal distention, anal bleeding, blood in stool, constipation, diarrhea, nausea, rectal pain and vomiting.  ?Genitourinary:  Positive for menstrual problem and pelvic pain (over surgical scar).  ?All other systems reviewed and are negative. ? ? ?Physical Exam ?Triage Vital Signs ?ED Triage Vitals  ?Enc Vitals Group  ?   BP 03/24/22 1417 115/79  ?   Pulse Rate 03/24/22 1417 61  ?   Resp 03/24/22 1417 16  ?   Temp 03/24/22 1417 97.9 ?F (36.6 ?C)  ?   Temp Source 03/24/22 1417 Oral  ?   SpO2 03/24/22 1417 100 %  ?   Weight --   ?   Height --   ?   Head Circumference --   ?   Peak Flow --   ?   Pain Score 03/24/22 1416 10  ?   Pain Loc --   ?   Pain Edu? --   ?   Excl. in GC? --   ? ?No data found. ? ?Updated Vital Signs ?BP 115/79 (BP Location: Left Arm)   Pulse 61   Temp 97.9 ?F (36.6 ?C) (Oral)   Resp 16   LMP 12/02/2021   SpO2 100%  ? ?Visual Acuity ?Right Eye Distance:   ?Left Eye  Distance:   ?Bilateral Distance:   ? ?Right Eye Near:   ?Left Eye Near:    ?Bilateral Near:    ? ?Physical Exam ?Vitals and nursing note reviewed.  ?Constitutional:   ?   General: She is not in acute distress. ?   Appearance: She is well-developed. She is obese. She is not ill-appearing, toxic-appearing or diaphoretic.  ?HENT:  ?   Head: Normocephalic and atraumatic.  ?   Mouth/Throat:  ?   Mouth: Mucous membranes are moist.  ?   Pharynx: Oropharynx is clear. No pharyngeal swelling or oropharyngeal exudate.  ?Eyes:  ?   General: No scleral icterus. ?   Extraocular Movements: Extraocular movements intact.  ?   Pupils: Pupils are equal, round, and reactive to light.  ?Cardiovascular:  ?   Rate and Rhythm: Normal rate and regular rhythm.  ?   Heart sounds: Normal heart sounds. No murmur heard. ?  No friction rub. No gallop.  ?Pulmonary:  ?   Effort: Pulmonary effort is normal. No respiratory distress.  ?   Breath sounds: Normal breath sounds. No stridor. No wheezing, rhonchi or rales.  ?Chest:  ?   Chest wall: No tenderness.  ?Abdominal:  ?   General: Abdomen is flat. A surgical scar is present. Bowel sounds are normal. There is no distension or abdominal bruit. There are no signs of injury.  ?   Palpations: Abdomen is soft. There is no hepatomegaly, splenomegaly, mass or pulsatile mass.  ?   Tenderness: There is no abdominal tenderness. There is no right CVA tenderness, left CVA tenderness, guarding or rebound. Negative signs include Murphy's sign, Rovsing's sign, McBurney's sign, psoas sign and obturator sign.  ?   Hernia: No hernia is present.  ?   Comments: Well healed surgical scar from C-section to lower abd/ pelvic region. No reproducible tenderness to palpation  ?Genitourinary: ?   Adnexa:     ?   Right: No tenderness.      ?   Left: No tenderness.    ?Skin: ?   General: Skin is warm.  ?   Capillary Refill: Capillary refill takes less than 2 seconds.  ?  Coloration: Skin is not cyanotic or pale.  ?   Findings:  No rash.  ?Neurological:  ?   General: No focal deficit present.  ?   Mental Status: She is alert.  ?Psychiatric:     ?   Mood and Affect: Mood normal.     ?   Behavior: Behavior normal.  ? ? ? ?UC Treatments / Results  ?Labs ?(all labs ordered are listed, but only abnormal results are displayed) ?Labs Reviewed  ?POCT URINALYSIS DIPSTICK, ED / UC  ?POC URINE PREG, ED  ? ? ?EKG ? ? ?Radiology ?No results found. ? ?Procedures ?Procedures (including critical care time) ? ?Medications Ordered in UC ?Medications - No data to display ? ?Initial Impression / Assessment and Plan / UC Course  ?I have reviewed the triage vital signs and the nursing notes. ? ?Pertinent labs & imaging results that were available during my care of the patient were reviewed by me and considered in my medical decision making (see chart for details). ? ?  ? ?Chronic pelvic pain -patient has had the symptoms for 2 years.  She presents to urgent care due to a slight increase in her discomfort last evening.  Overall, she appears clinically stable with a relatively benign abdominal exam.  Her symptoms sound possibly consistent with endometriosis versus pelvic adhesions from her prior surgery.  I will give her mefenamic acid/Ponstel to see if we can help with may be some inflammatory pain.  I recommended she follow-up with her PCP for a referral to gynecology, laparoscopy may be necessary for determination of symptoms. ?Anemia -patient concerned with upper gas pain as well.  She is wanting referral to gastroenterology, discussed with patient this would need to come from PCP.  Has already been treated for iron deficiency anemia secondary to menorrhagia by primary.  Patient appears stable with vital signs normal, and no exam findings to warrant ER evaluation today. ? ?Final Clinical Impressions(s) / UC Diagnoses  ? ?Final diagnoses:  ?Chronic pelvic pain in female  ?Anemia, unspecified type  ? ? ? ?Discharge Instructions   ? ?  ?Your symptoms are  concerning for possibly endometriosis, or possibly adhesions from your previous c-section.  ?Please follow up with your PCP to obtain a referral to gynecology. ?You can start mefenamic acid today, which is an Music therapist

## 2022-03-24 NOTE — ED Triage Notes (Signed)
Pt c/o abd pains since her last surgery in 2021. Reports last night pains got worse. Pt denies n/v, urinary or bowel problems.  ?

## 2022-04-09 ENCOUNTER — Encounter: Payer: Self-pay | Admitting: Obstetrics and Gynecology

## 2022-04-09 ENCOUNTER — Ambulatory Visit (INDEPENDENT_AMBULATORY_CARE_PROVIDER_SITE_OTHER): Payer: Medicaid Other | Admitting: Obstetrics and Gynecology

## 2022-04-09 VITALS — BP 127/75 | HR 78 | Wt 257.2 lb

## 2022-04-09 DIAGNOSIS — Z3202 Encounter for pregnancy test, result negative: Secondary | ICD-10-CM

## 2022-04-09 DIAGNOSIS — N912 Amenorrhea, unspecified: Secondary | ICD-10-CM

## 2022-04-09 LAB — POCT PREGNANCY, URINE: Preg Test, Ur: NEGATIVE

## 2022-04-09 NOTE — Progress Notes (Signed)
GYN U/S scheduled for 04/18/22 @ 0830.  Pt notified.  ? ?Alexandru Moorer,RN  ?04/09/22 ?

## 2022-04-09 NOTE — Progress Notes (Signed)
Kathy Richards presents for DUB. ?Pt has had this problem for several months. She been on OCP's in the past but has not taken properly. Last dose of Depo this past December. ? ?PE AF VSS ?Lungs clear Heart RRR ?Abd soft + BS ? ?A/P DUB ? ?Will check GYN U/S. F/U per GYN U/S. ?

## 2022-04-18 ENCOUNTER — Ambulatory Visit (HOSPITAL_COMMUNITY): Admission: RE | Admit: 2022-04-18 | Payer: Medicaid Other | Source: Ambulatory Visit

## 2022-04-19 ENCOUNTER — Ambulatory Visit (HOSPITAL_COMMUNITY)
Admission: RE | Admit: 2022-04-19 | Discharge: 2022-04-19 | Disposition: A | Discharge status: Home / Self Care | Payer: Medicaid Other | Source: Ambulatory Visit | Attending: Obstetrics and Gynecology | Admitting: Obstetrics and Gynecology

## 2022-04-19 DIAGNOSIS — N912 Amenorrhea, unspecified: Secondary | ICD-10-CM | POA: Diagnosis present

## 2022-04-20 ENCOUNTER — Telehealth: Payer: Self-pay

## 2022-04-20 NOTE — Telephone Encounter (Signed)
S/w pt via interpreter and advised of need for follow up u/s. [facility] patient, routed to office for scheduling ?

## 2022-04-21 ENCOUNTER — Ambulatory Visit (HOSPITAL_COMMUNITY)
Admission: EM | Admit: 2022-04-21 | Discharge: 2022-04-21 | Disposition: A | Discharge status: Home / Self Care | Payer: Medicaid Other | Attending: Emergency Medicine | Admitting: Emergency Medicine

## 2022-04-21 ENCOUNTER — Encounter (HOSPITAL_COMMUNITY): Payer: Self-pay

## 2022-04-21 DIAGNOSIS — R102 Pelvic and perineal pain: Secondary | ICD-10-CM

## 2022-04-21 MED ORDER — KETOROLAC TROMETHAMINE 60 MG/2ML IM SOLN
60.0000 mg | Freq: Once | INTRAMUSCULAR | Status: AC
  Administered 2022-04-21: 60 mg via INTRAMUSCULAR

## 2022-04-21 MED ORDER — KETOROLAC TROMETHAMINE 60 MG/2ML IM SOLN
INTRAMUSCULAR | Status: AC
  Filled 2022-04-21: qty 2

## 2022-04-21 MED ORDER — IBUPROFEN 800 MG PO TABS: 800.0000 mg | ORAL_TABLET | Freq: Three times a day (TID) | ORAL | 0 refills | Status: DC | PRN

## 2022-04-21 NOTE — ED Triage Notes (Signed)
Pt presents with ongoing abdominal pain for past few days; pt had ultrasound a few days ago with GYN ?

## 2022-04-21 NOTE — ED Provider Notes (Signed)
?[facility] ? ? ? ?CSN: [id number] ?Arrival date & time: 04/21/22  1157 ?  ? ?HISTORY  ? ?Chief Complaint  ?Patient presents with  ? Abdominal Pain  ? ?HPI ?Kathy Richards is a 31 y.o. female. Patient presents to urgent care with ongoing abdominal pain for the past few days, patient had an ultrasound performed by gynecology 2 days ago.  Patient states she has not gotten the results of her ultrasound yet.  Patient states her pain is not any different than it was 2 days ago.  Patient is requesting a note for work. ? ?The history is provided by the patient.  ?Past Medical History:  ?Diagnosis Date  ? Microcytic anemia 11/21/2020  ? Obesity   ? ?Patient Active Problem List  ? Diagnosis Date Noted  ? Amenorrhea 01/21/2022  ? 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 Language barrier 05/11/2021  ? Obesity (BMI 30-39.9) 11/21/2020  ? Refugee health examination 09/20/2020  ? ?Past Surgical History:  ?Procedure Laterality Date  ? CESAREAN SECTION    ? x2  ? ?OB History   ? ? Gravida  ?3  ? Para  ?3  ? Term  ?3  ? Preterm  ?0  ? AB  ?0  ? Living  ?1  ?  ? ? SAB  ?0  ? IAB  ?0  ? Ectopic  ?0  ? Multiple  ?0  ? Live Births  ?3  ?   ?  ? Obstetric Comments  ?Two children died shortly after birth-- both Cesarean deliveries   ?  ? ?  ? ?Home Medications   ? ?Prior to Admission medications   ?Not on File  ? ? ?Family History ?Family History  ?Problem Relation Age of Onset  ? HIV Mother   ? Hypertension Father   ? ?Social History ?Social History  ? ?Tobacco Use  ? Smoking status: Never  ? Smokeless tobacco: Never  ?Substance Use Topics  ? Alcohol use: Not Currently  ? Drug use: Never  ? ?Allergies   ?Patient has no known allergies. ? ?Review of Systems ?Review of Systems ?Pertinent findings noted in history of present illness.  ? ?Physical Exam ?Triage Vital Signs ?ED Triage Vitals  ?Enc Vitals Group  ?   BP 10/20/21 0827 (!) 147/82  ?   Pulse Rate 10/20/21 0827 72  ?   Resp 10/20/21 0827 18  ?   Temp 10/20/21 0827 98.3 ?F (36.8  ?C)  ?   Temp Source 10/20/21 0827 Oral  ?   SpO2 10/20/21 0827 98 %  ?   Weight --   ?   Height --   ?   Head Circumference --   ?   Peak Flow --   ?   Pain Score 10/20/21 0826 5  ?   Pain Loc --   ?   Pain Edu? --   ?   Excl. in GC? --   ?No data found. ? ?Updated Vital Signs ?BP 131/72 (BP Location: Left Arm)   Pulse 69   Temp 98.8 ?F (37.1 ?C) (Oral)   Resp 18   LMP 12/10/2021 (Approximate)   SpO2 100%  ? ?Physical Exam ?Vitals and nursing note reviewed.  ?Constitutional:   ?   General: She is not in acute distress. ?   Appearance: Normal appearance. She is not ill-appearing.  ?HENT:  ?   Head: Normocephalic and atraumatic.  ?Eyes:  ?   General: Lids are normal.     ?  Right eye: No discharge.     ?   Left eye: No discharge.  ?   Extraocular Movements: Extraocular movements intact.  ?   Conjunctiva/sclera: Conjunctivae normal.  ?   Right eye: Right conjunctiva is not injected.  ?   Left eye: Left conjunctiva is not injected.  ?Neck:  ?   Trachea: Trachea and phonation normal.  ?Cardiovascular:  ?   Rate and Rhythm: Normal rate and regular rhythm.  ?   Pulses: Normal pulses.  ?   Heart sounds: Normal heart sounds. No murmur heard. ?  No friction rub. No gallop.  ?Pulmonary:  ?   Effort: Pulmonary effort is normal. No accessory muscle usage, prolonged expiration or respiratory distress.  ?   Breath sounds: Normal breath sounds. No stridor, decreased air movement or transmitted upper airway sounds. No decreased breath sounds, wheezing, rhonchi or rales.  ?Chest:  ?   Chest wall: No tenderness.  ?Abdominal:  ?   General: Abdomen is flat. Bowel sounds are normal. There is no distension.  ?   Palpations: Abdomen is soft.  ?   Tenderness: There is abdominal tenderness in the left lower quadrant. There is no right CVA tenderness or left CVA tenderness.  ?   Hernia: No hernia is present.  ?Musculoskeletal:     ?   General: Normal range of motion.  ?   Cervical back: Normal range of motion and neck supple. Normal  range of motion.  ?Lymphadenopathy:  ?   Cervical: No cervical adenopathy.  ?Skin: ?   General: Skin is warm and dry.  ?   Findings: No erythema or rash.  ?Neurological:  ?   General: No focal deficit present.  ?   Mental Status: She is alert and oriented to person, place, and time.  ?Psychiatric:     ?   Mood and Affect: Mood normal.     ?   Behavior: Behavior normal.  ? ? ?Visual Acuity ?Right Eye Distance:   ?Left Eye Distance:   ?Bilateral Distance:   ? ?Right Eye Near:   ?Left Eye Near:    ?Bilateral Near:    ? ?UC Couse / Diagnostics / Procedures:  ?  ?EKG ? ?Radiology ?No results found. ? ?Procedures ?Procedures (including critical care time) ? ?UC Diagnoses / Final Clinical Impressions(s)   ?I have reviewed the triage vital signs and the nursing notes. ? ?Pertinent labs & imaging results that were available during my care of the patient were reviewed by me and considered in my medical decision making (see chart for details).   ? ?Final diagnoses:  ?Pelvic pain in female  ? ?Patient provided with ibuprofen for her pain.  Patient advised to follow-up with her gynecologist on Monday morning to discuss the results of her ultrasound. ? ?ED Prescriptions   ? ? Medication Sig Dispense Auth. Provider  ? ibuprofen (ADVIL) 800 MG tablet Take 1 tablet (800 mg total) by mouth every 8 (eight) hours as needed for up to 21 doses for fever, headache, mild pain or moderate pain. 21 tablet Theadora Rama Scales, PA-C  ? ?  ? ?PDMP not reviewed this encounter. ? ?Pending results:  ?Labs Reviewed - No data to display ? ?Medications Ordered in UC: ?Medications  ?ketorolac (TORADOL) injection 60 mg (60 mg Intramuscular Given 04/21/22 1344)  ? ? ?Disposition Upon Discharge:  ?Condition: stable for discharge home ?Home: take medications as prescribed; routine discharge instructions as discussed; follow up as advised. ? ?Patient presented with  an acute illness with associated systemic symptoms and significant discomfort requiring  urgent management. In my opinion, this is a condition that a prudent lay person (someone who possesses an average knowledge of health and medicine) may potentially expect to result in complications if not addressed urgently such as respiratory distress, impairment of bodily function or dysfunction of bodily organs.  ? ?Routine symptom specific, illness specific and/or disease specific instructions were discussed with the patient and/or caregiver at length.  ? ?As such, the patient has been evaluated and assessed, work-up was performed and treatment was provided in alignment with urgent care protocols and evidence based medicine.  Patient/parent/caregiver has been advised that the patient may require follow up for further testing and treatment if the symptoms continue in spite of treatment, as clinically indicated and appropriate. ? ?Patient/parent/caregiver has been advised [facility] or [facility]CP if no better; to PCP or the Emergency Department if new signs and symptoms develop, or if the current signs or symptoms continue to change or worsen for further workup, evaluation and treatment as clinically indicated and appropriate ? ?The patient will follow up with their current PCP if and as advised. If the patient does not currently have a PCP we will assist them in obtaining one.  ? ?The patient may need specialty follow up if the symptoms continue, in spite of conservative treatment and management, for further workup, evaluation, consultation and treatment as clinically indicated and appropriate. ? ? ?Patient/parent/caregiver verbalized understanding and agreement of plan as discussed.  All questions were addressed during visit.  Please see discharge instructions below for further details of plan. ? ?Discharge Instructions: ? ? ?Discharge Instructions   ? ?  ?The report from your ultrasound shows that you have a cyst on your left ovary which may be the source of your pain at this time.   You were provided with  an injection of ketorolac for pain during your visit today.  I also provided you with a prescription for ibuprofen that you can taking 8 hours from now to continue pain relief. ? ?Please reach out to your provid

## 2022-04-21 NOTE — Discharge Instructions (Signed)
The report from your ultrasound shows that you have a cyst on your left ovary which may be the source of your pain at this time.   You were provided with an injection of ketorolac for pain during your visit today.  I also provided you with a prescription for ibuprofen that you can taking 8 hours from now to continue pain relief. ? ?Please reach out to your provider Monday morning to discuss the findings of your ultrasound. ? ?Thank you for visiting urgent care today. ?

## 2022-04-23 ENCOUNTER — Telehealth: Payer: Self-pay

## 2022-04-23 DIAGNOSIS — N912 Amenorrhea, unspecified: Secondary | ICD-10-CM

## 2022-04-23 DIAGNOSIS — N949 Unspecified condition associated with female genital organs and menstrual cycle: Secondary | ICD-10-CM

## 2022-04-23 NOTE — Telephone Encounter (Signed)
Follow up US scheduled for 07/02/22 at 1pm.  ?Call placed to pt with CAP interpreter Mohammed [phone]. Pt did not answer. Pt left VM with scheduled [place] appt. Pt to return call to MFM to reschedule.  ?Colletta Maryland, RN  ? ?

## 2022-04-23 NOTE — Telephone Encounter (Signed)
-----   Message from Katrina Stack, RN sent at 04/20/2022 11:23 AM EDT ----- ?Hi, ?I notified this patient about her results and need for follow up u/s. She is a Therapist, music patient and will need to get scheduled. Thank you ?----- Message ----- ?From: Hermina Staggers, MD ?Sent: 04/19/2022   9:44 PM EDT ?To: [facility] Clinical Pool ? ?Please let Ms Blackerby that her GYN U/S was normal except for an ovarian cyst. ?Please schedule f/u U/S in 6-8 weeks. ? ?Thanks ?Casimiro Needle  ? ?

## 2022-04-27 ENCOUNTER — Ambulatory Visit (INDEPENDENT_AMBULATORY_CARE_PROVIDER_SITE_OTHER): Payer: Medicaid Other | Admitting: Family Medicine

## 2022-04-27 VITALS — BP 120/67 | HR 67 | Ht 68.0 in | Wt 260.5 lb

## 2022-04-27 DIAGNOSIS — N949 Unspecified condition associated with female genital organs and menstrual cycle: Secondary | ICD-10-CM | POA: Diagnosis present

## 2022-04-27 DIAGNOSIS — N912 Amenorrhea, unspecified: Secondary | ICD-10-CM

## 2022-04-27 DIAGNOSIS — Z3200 Encounter for pregnancy test, result unknown: Secondary | ICD-10-CM | POA: Diagnosis not present

## 2022-04-27 DIAGNOSIS — R102 Pelvic and perineal pain: Secondary | ICD-10-CM | POA: Insufficient documentation

## 2022-04-27 LAB — POCT URINE PREGNANCY: Preg Test, Ur: NEGATIVE

## 2022-04-27 MED ORDER — IBUPROFEN 800 MG PO TABS: 800.0000 mg | ORAL_TABLET | Freq: Three times a day (TID) | ORAL | 0 refills | Status: DC | PRN

## 2022-04-27 NOTE — Assessment & Plan Note (Signed)
Follows with gynecology.  No IUP seen on recent pelvic ultrasound.  She is not interested in birth control at this time.  Advised to use barrier contraceptive methods.  Urine pregnancy test was negative. ?

## 2022-04-27 NOTE — Progress Notes (Signed)
? ?  SUBJECTIVE:  ? ?CHIEF COMPLAINT / HPI:  ? ?A Swahili interpreter was used for this encounter:  Name: [place] ID [phone] ? ? ?Kathy Richards is a 31 y.o. female here for ongoing abdominal pain states no one has told her what is going on.   Patient had an pelvic ultrasound that showed a ovarian cyst.  Two days later, she went to the urgent care for abdominal pain.  She picked up the ibuprofen that was sent in on Monday.  The ibuprofen is helping.  She requested medication to make the cyst go away. ? ?Patient's last menstrual period was 11/23/2021. She is not using condoms or birth control. She is sexually active. Previously used Depo.  States that she feels like something is moving around in there. ? ? ? ?PERTINENT  PMH / PSH: reviewed and updated as appropriate  ? ?OBJECTIVE:  ? ?BP 120/67   Pulse 67   Ht 5\' 8"  (1.727 m)   Wt 260 lb 8 oz (118.2 kg)   LMP 11/23/2021   SpO2 99%   BMI 39.61 kg/m?   ? ?GEN: well appearing female in no acute distress  ?CVS: well perfused  ?RESP: speaking in full sentences without pause, no respiratory distress  ?ABD: Soft, left lower abdomen fullness compared to right, non-tender, no CVA tenderness, no rebound, no guarding ?SKIN: No overlying abdominal skin changes ? ?ASSESSMENT/PLAN:  ? ? ?Adnexal cyst ?Ultrasound on 04/19/2022 showing normal uterus and a nearly 4 x4 cm left adnexal cyst.  Recommended repeat ultrasound in 2 to 3 months.  Ovaries were not visualized. Continue Ibuprofen 800 mg.  Discussed that surgery is not recommended at this time.  Follow-up ultrasound scheduled for July 10.  Advised patient to get her follow-up ultrasound and then follow-up with her GYN, Dr. July 12, office.  She agrees with this plan. ? ?Amenorrhea ?Follows with gynecology.  No IUP seen on recent pelvic ultrasound.  She is not interested in birth control at this time.  Advised to use barrier contraceptive methods.  Urine pregnancy test was negative.  ? ? ? ? ?Waynard Edwards, DO ?PGY-3, [facility] ?04/27/2022  ? ? ? ? ? ? ? ? ?

## 2022-04-27 NOTE — Patient Instructions (Addendum)
Endelea kuchukua ibuprofen kila baada ya masaa 8 kama inahitajika kwa maumivu. Usitumie kwa zaidi ya siku 10 kwa wakati mmoja. ? ?Upigaji sauti unaojirudia utaratibiwa tarehe 10 Julai 2023 saa 12:10 PM. Kathy Richards umetimiza miadi hiyo. Hii iko kwenye Ultrasound ya [place]. ? ?Continue taking the ibuprofen every 8 hours as needed for pain. Do not use for greater than 10 days at a time.   ? ?Your repeat ultrasound in scheduled for July 02, 2022 at 12:10 PM.  Be sure the keep that appointment. This is located at the [facility].  ? ?  ?

## 2022-04-27 NOTE — Assessment & Plan Note (Signed)
Ultrasound on 04/19/2022 showing normal uterus and a nearly 4 x4 cm left adnexal cyst.  Recommended repeat ultrasound in 2 to 3 months.  Ovaries were not visualized. Continue Ibuprofen 800 mg.  Discussed that surgery is not recommended at this time.  Follow-up ultrasound scheduled for July 10.  Advised patient to get her follow-up ultrasound and then follow-up with her GYN, Dr. Waynard Edwards, office.  She agrees with this plan. ?

## 2022-06-28 ENCOUNTER — Ambulatory Visit
Admission: RE | Admit: 2022-06-28 | Discharge: 2022-06-28 | Disposition: A | Discharge status: Home / Self Care | Payer: Medicaid Other | Source: Ambulatory Visit | Attending: Obstetrics and Gynecology | Admitting: Obstetrics and Gynecology

## 2022-06-28 DIAGNOSIS — N912 Amenorrhea, unspecified: Secondary | ICD-10-CM | POA: Insufficient documentation

## 2022-06-28 DIAGNOSIS — N949 Unspecified condition associated with female genital organs and menstrual cycle: Secondary | ICD-10-CM | POA: Diagnosis present

## 2022-07-01 IMAGING — US US PELVIS COMPLETE WITH TRANSVAGINAL
1 series · 14 of 25 positions shown · non-contrast
Comparison: None

CLINICAL DATA: Menorrhagia, last menstrual period 01/01/2021,
premenopausal.



[Series 1: us pelvic complete with transvaginal · 14 of 92 slices shown]
[im 1/92]
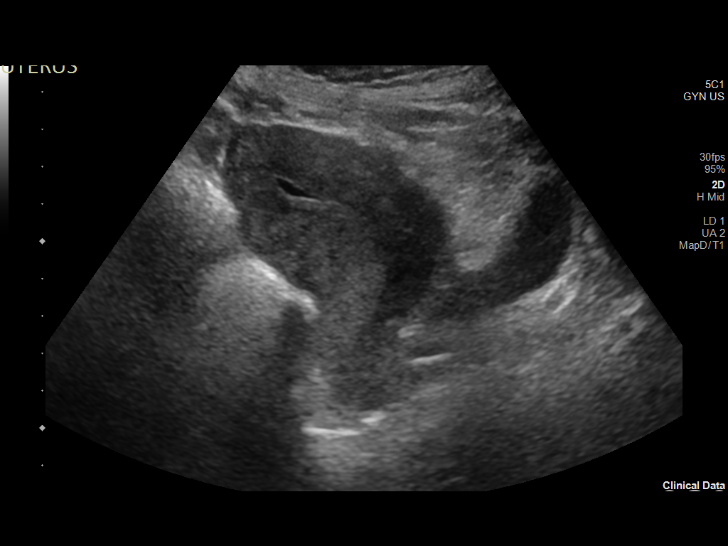
[im 8/92]
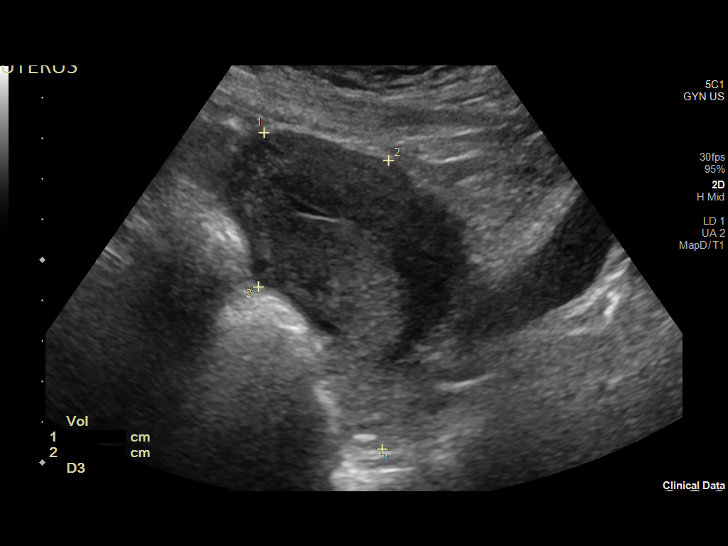
[im 16/92]
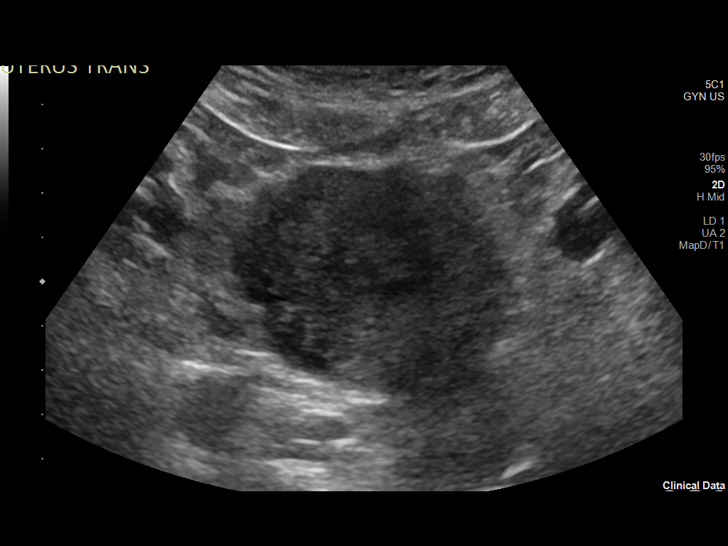
[im 23/92]
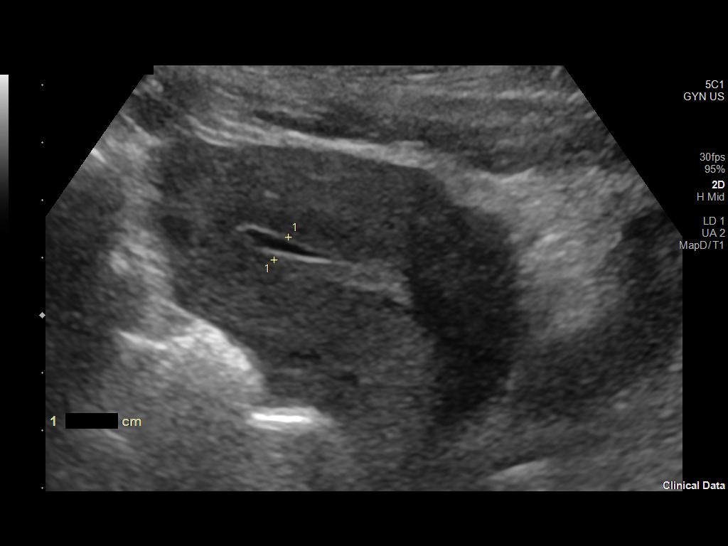
[im 31/92]
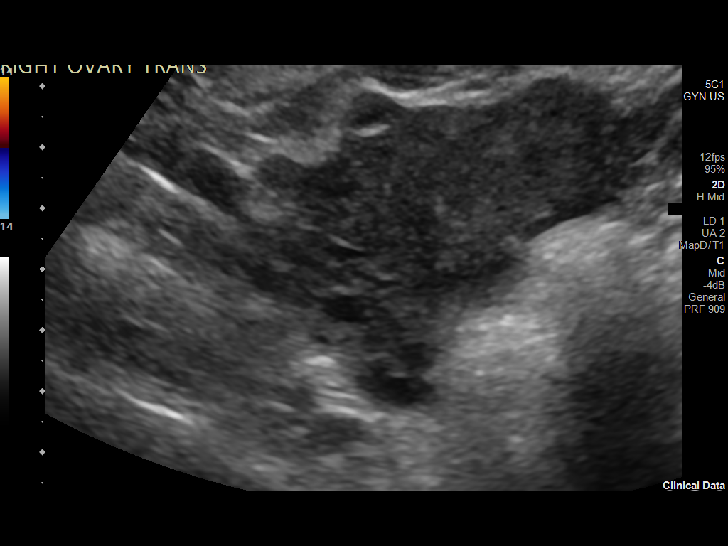
[im 35/92]
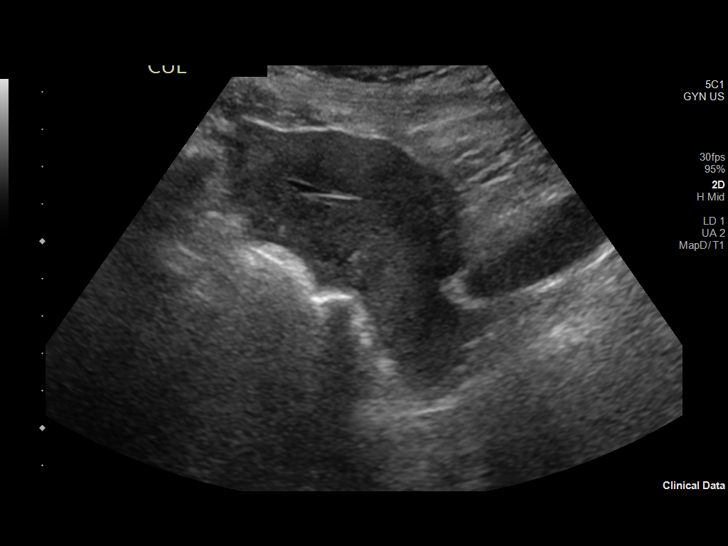
[im 42/92]
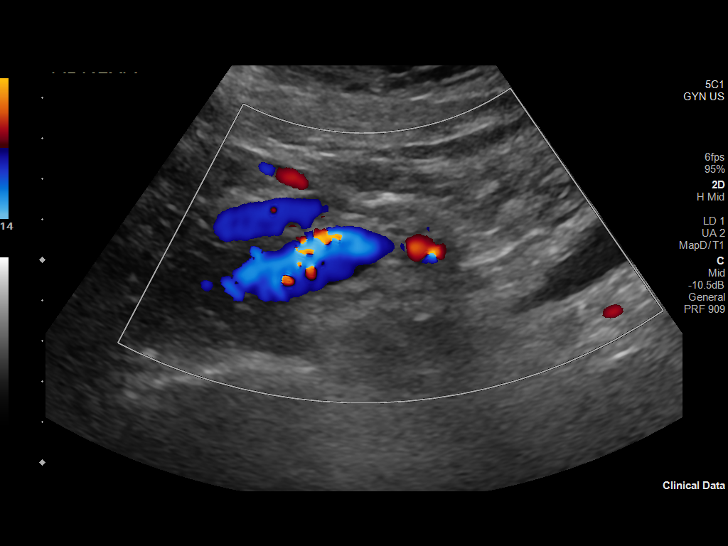
[im 50/92]
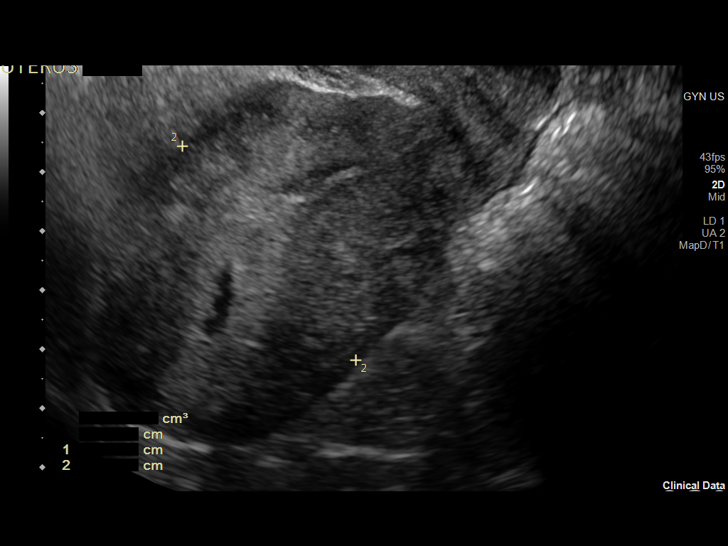
[im 57/92]
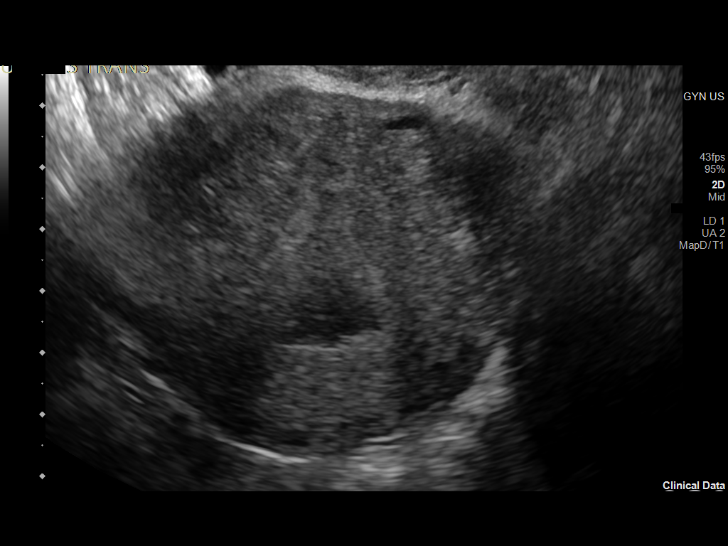
[im 61/92]
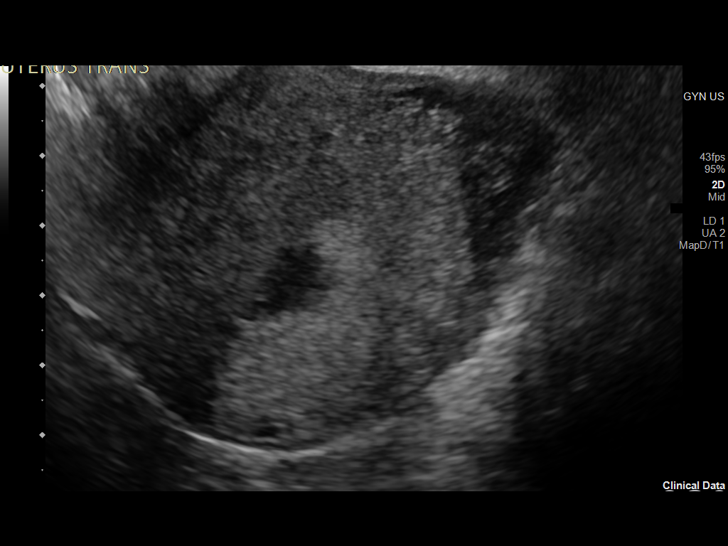
[im 69/92]
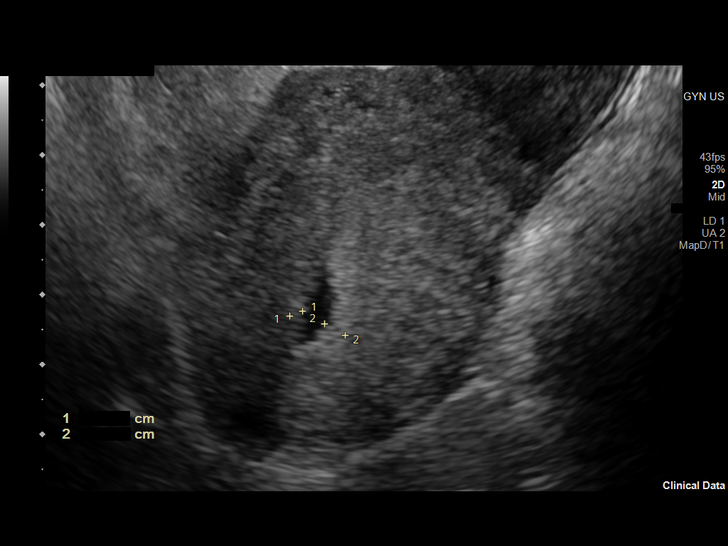
[im 76/92]
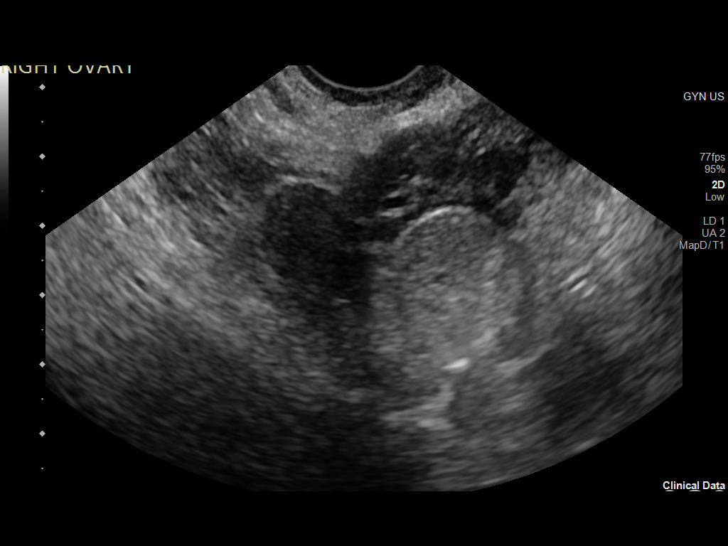
[im 84/92]
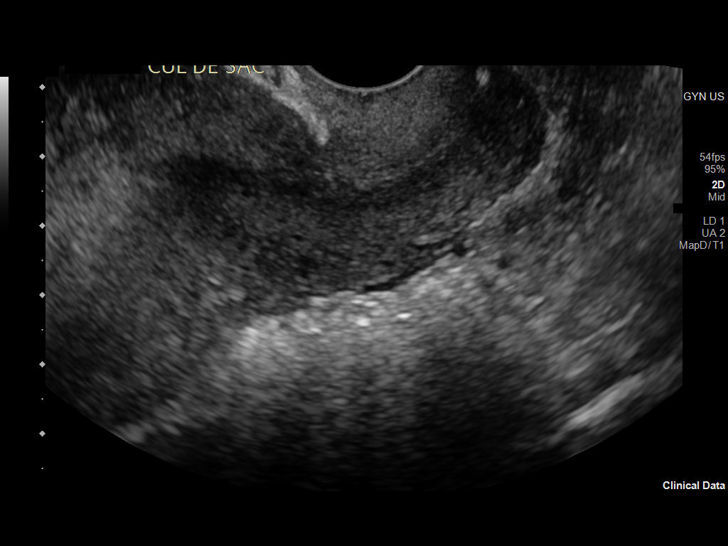
[im 92/92]
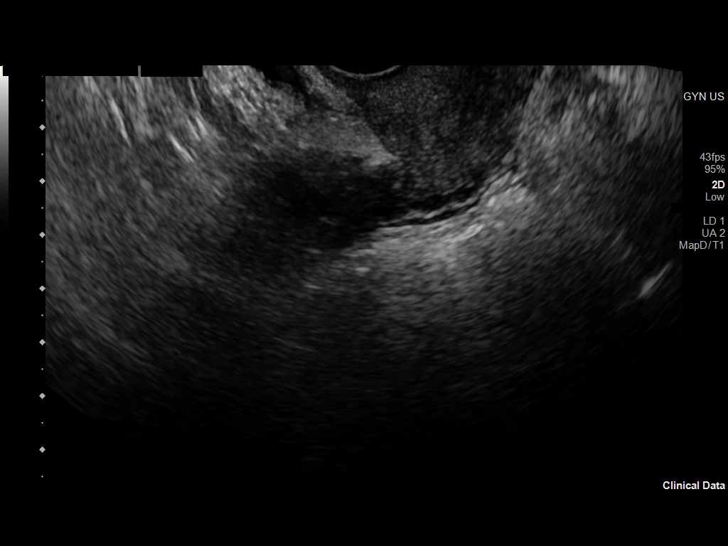

[14 of 25 positions shown; findings below may reference images not displayed]

FINDINGS: Uterus

Measurements: 8.3 x 4.5 x 6.9 cm = volume: 134 mL. No fibroids or
other mass visualized.

Endometrium

Thickness: 5mm. Fluid noted within the endometrial canal measuring
1.2 x 0.3 x 0.9 cm.

Right ovary

Measurements: 2.5 x 1.3 x 1.9 cm = volume: 3.3 mL. Normal
appearance/no adnexal mass.

Left ovary: Not visualized.

Other findings

No abnormal free fluid.  Pelvic varicosities.
IMPRESSION: 1. Trace fluid noted within the endometrium. Normal endometrial
thickness.
2. The left ovary is not visualized.
3. Pelvic varices.

## 2022-07-02 ENCOUNTER — Other Ambulatory Visit: Payer: Medicaid Other

## 2022-07-11 ENCOUNTER — Telehealth: Payer: Self-pay | Admitting: *Deleted

## 2022-07-11 NOTE — Telephone Encounter (Signed)
-----   Message from Hermina Staggers, MD sent at 07/09/2022 12:13 PM EDT ----- Please let pt know that her GYN U/S is normal. No further U/S needed.  Thanks  Casimiro Needle

## 2022-07-11 NOTE — Telephone Encounter (Signed)
I called patient with [employer] [phone] at her mobile number and no answer and no voicemail. I called her home number and her brother answered and I asked him if Riot available. He states she is not with him and I asked him to ask her to call us regarding information from her doctor.  Nancy Fetter

## 2022-07-13 NOTE — Telephone Encounter (Signed)
Call placed to pt with interpreter Danya # [phone]. Spoke with pt and gave normal results per Dr Alysia Penna. Pt verbalized understanding.  Rochella Benner,RNC

## 2022-09-07 ENCOUNTER — Encounter: Payer: Self-pay | Admitting: Family Medicine

## 2022-09-07 ENCOUNTER — Other Ambulatory Visit: Payer: Self-pay

## 2022-09-07 ENCOUNTER — Ambulatory Visit (INDEPENDENT_AMBULATORY_CARE_PROVIDER_SITE_OTHER): Payer: Medicaid Other | Admitting: Family Medicine

## 2022-09-07 VITALS — BP 125/77 | HR 69 | Wt 249.6 lb

## 2022-09-07 DIAGNOSIS — M546 Pain in thoracic spine: Secondary | ICD-10-CM | POA: Diagnosis not present

## 2022-09-07 DIAGNOSIS — N949 Unspecified condition associated with female genital organs and menstrual cycle: Secondary | ICD-10-CM

## 2022-09-07 DIAGNOSIS — E669 Obesity, unspecified: Secondary | ICD-10-CM | POA: Diagnosis not present

## 2022-09-07 MED ORDER — NAPROXEN 500 MG PO TABS: 500.0000 mg | ORAL_TABLET | Freq: Two times a day (BID) | ORAL | 0 refills | Status: DC

## 2022-09-07 NOTE — Assessment & Plan Note (Signed)
Resolved on follow up imaging

## 2022-09-07 NOTE — Patient Instructions (Signed)
It was wonderful to see you today.  Please bring ALL of your medications with you to every visit.   Today we talked about: - Taking naproxen twice per day with food This is at your pharmacy   Stretching exercises every day   Please ice your back every day after work    Reunion naproxen mara mbili kwa siku na chakula. Hii ni kwenye duka lako la dawa   [place] ya [place] kila siku  [place] barafu mgongo wako kila siku baada Baruch Merl Please follow up in 1 months   Thank you for choosing [facility].   Please call [phone] with any questions about today's appointment.  Please be sure to schedule follow up at the front  desk before you leave today.   Terisa Starr, MD  Family Medicine

## 2022-09-07 NOTE — Assessment & Plan Note (Signed)
Discussed increasing activity and improving core strength for back

## 2022-09-07 NOTE — Progress Notes (Signed)
    SUBJECTIVE:   CHIEF COMPLAINT: follow up HPI:   Kathy Richards is a 31 y.o.  with history notable for social stressors and irregular menses  presenting for follow up. The patient speaks Swahili as their primary language.  An interpreter was used for the entire visit.   Living with mom, brothers, and daughter. Kathy Richards is doing well. Kathy Richards has a new job. Work includes bending over [employer]. 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  She reports midline back pain. No trauma to area. No sciatica or bowel/bladder incontinence. LMP August 29th. Unrelated to bowel movement. Denies urinary symptoms. Onset when she started her new role in her job bending over a lot.   PERTINENT  PMH / PSH/Family/Social History : updated and reviewed as appropriate No longer sexually active   OBJECTIVE:   BP 125/77   Pulse 69   Wt 249 lb 9.6 oz (113.2 kg)   SpO2 100%   BMI 37.95 kg/m   Today's weight:  Last Weight  Most recent update: 09/07/2022  9:09 AM    Weight  113.2 kg (249 lb 9.6 oz)            Review of prior weights: [employer]   09/07/22 0908  Weight: 249 lb 9.6 oz (113.2 kg)     Cardiac: Regular rate and rhythm. Normal S1/S2. No murmurs, rubs, or gallops appreciated. Lungs: Clear bilaterally to ascultation.  Psych: Pleasant and appropriate, interactive today  MSK Normal gait TTP along paraspinal muscles of thoracic and lumbar area  Normal reflexes Preserved LE strenght   ASSESSMENT/PLAN:   Acute thoracic and low back pain New problem, uncertain cause, Likely non specific low back pain with myofascial cause, differential also considered by less likely renal pathology, acute compression fracture  No trauma, but clear trigger of increased activity at work Discussed core strength, stretches, RICE Rx Naproxen Given short duration and lack of radiation and absence of concerning symptoms, no imaging at this time Work note given to  return to usual place of work    Adnexal cyst Resolved on follow up imaging   Obesity (BMI 30-39.9) Discussed increasing activity and improving core strength for back    Kathy Starr, MD  Family Medicine Teaching Service  [facility]

## 2022-09-10 ENCOUNTER — Telehealth: Payer: Self-pay | Admitting: Family Medicine

## 2022-09-10 NOTE — Telephone Encounter (Signed)
Patient dropped off form at front desk for ADA Accommodation.  Verified that patient section of form has been completed.  Last DOS/WCC with PCP was 09/07/22.  Placed form in red team folder to be completed by clinical staff.    Patient states the request is urgent. Wanted to return to work today.    Creig Hines

## 2022-09-10 NOTE — Telephone Encounter (Signed)
Kathy Richards with employer called stating that they received the work note from Dr. Owens Shark but was needing to get some more details on specific accommodations (length of accomodation, breaks needed, etc.).  She did fax over the form on Friday and it is in provider's box.  [name],CMA

## 2022-09-11 ENCOUNTER — Telehealth: Payer: Self-pay | Admitting: Family Medicine

## 2022-09-11 NOTE — Telephone Encounter (Signed)
Patient called to check on the status of paper work being completed. I let her know that it was ready for pick up.   Made copy and placed in batch scanning. Placed her copy at the front desk.

## 2022-09-11 NOTE — Telephone Encounter (Signed)
Letter in letters tab. Please print for patient and place up front for her to pick up. Please let her know it is ready.   Dorris Singh, MD  Family Medicine Teaching Service

## 2022-09-11 NOTE — Telephone Encounter (Signed)
Patient came in stating that she needs letter for work stating how much weight she is able to lift. Please call patient at [phone] with any questions.

## 2022-09-11 NOTE — Telephone Encounter (Signed)
Reviewed, completed, and signed form.  Note routed to RN team inbasket and placed completed form in RN Wall pocket in the front office.  Raeqwon Lux M Neyland Pettengill, MD  

## 2022-09-12 NOTE — Telephone Encounter (Signed)
Updated work note in Engineer, materials tab. Please fax to her work.  Dorris Singh, MD  Family Medicine Teaching Service

## 2022-09-13 ENCOUNTER — Telehealth: Payer: Self-pay | Admitting: Family Medicine

## 2022-09-13 ENCOUNTER — Ambulatory Visit (INDEPENDENT_AMBULATORY_CARE_PROVIDER_SITE_OTHER): Payer: Medicaid Other | Admitting: Family Medicine

## 2022-09-13 VITALS — BP 120/60 | HR 68 | Ht 68.0 in | Wt 246.0 lb

## 2022-09-13 DIAGNOSIS — Z5689 Other problems related to employment: Secondary | ICD-10-CM

## 2022-09-13 DIAGNOSIS — M546 Pain in thoracic spine: Secondary | ICD-10-CM

## 2022-09-13 NOTE — Patient Instructions (Signed)
It was great to see you.  We have faxed the work note to your employer. Let us know if you have any further issues.  Take care, Dr Rock Nephew

## 2022-09-13 NOTE — Telephone Encounter (Signed)
Addressed by Dr. Rock Nephew today.  Dorris Singh, MD  Family Medicine Teaching Service

## 2022-09-13 NOTE — Telephone Encounter (Signed)
Patient did not want letter faxed to her job for fear that she would lose her position.  Patients states that "she needs her job because she is a single parent".  Patient has appointment 09/13/2022 with ATC (Dr. Rock Nephew) and she will discuss what she actually needs that letter to reflect to her employer.  Will await until patient determines what employer needs to fax letter.  Ozella Almond, [place]

## 2022-09-13 NOTE — Telephone Encounter (Signed)
Crystal called checking status of accommodations. She is needing a doctors note or however you guys would like to send it.   Limit in bending and lifting or none at all or how much.  Can patient reach above head to grab something.   What is patients weight requirement with lifting and grabbing.    Needs to be faxed to   [phone]  ATTN: Crystal    Thanks!

## 2022-09-13 NOTE — Progress Notes (Signed)
    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 TTP along paraspinal muscles of thoracic and lumbar area. Normal gait, normal strength in bilateral lower extremities  ASSESSMENT/PLAN:   Thoracic and Low Back Pain Agree with previous evaluation on 9/15, likely myofascial cause exacerbated by repetitive bending/lifting at work. Patient given work note explicitly outlining her restrictions. Note printed for patient AND faxed to [place] at her place of employment (see separate telephone note from today).   Alcus Dad, MD [place]

## 2022-09-25 ENCOUNTER — Telehealth: Payer: Self-pay | Admitting: Family Medicine

## 2022-09-25 NOTE — Telephone Encounter (Signed)
Reviewed, completed, and signed form.  Note routed to RN team inbasket and placed completed form in RN Wall pocket in the front office.Please add our tax ID.   Martyn Malay, MD

## 2022-09-27 NOTE — Telephone Encounter (Signed)
Tax ID added. Form faxed to [phone].   Copy made and placed in batch scanning.   Talbot Grumbling, RN

## 2022-10-05 ENCOUNTER — Telehealth: Payer: Self-pay | Admitting: Family Medicine

## 2022-10-05 NOTE — Telephone Encounter (Signed)
Patient is calling and would like for Dr. Owens Shark to call her as soon as possible to discuss issues with her employer and her short term disability.   The best call back number is [phone]

## 2022-10-08 NOTE — Telephone Encounter (Signed)
The patient speaks Swahili as their primary language.  An interpreter was used for the entire visit.   Attempted to call X[employer]. Unable to reach patient. If patient calls back, please identify details of issue with paperwork.  Dorris Singh, MD  Family Medicine Teaching Service

## 2022-10-11 NOTE — Telephone Encounter (Signed)
Patient returns call to  nurse line. She states that [employer] needs date of next office visit included on paperwork.   Included that patient's next office visit will be on 10/30.   Faxed to [employer] at [phone].  Talbot Grumbling, RN

## 2022-10-11 NOTE — Telephone Encounter (Signed)
Received phone call from [place] at [facility] regarding claim. Claim needs return to work date.   [place] is requesting returned phone call with this information at [phone].  Talbot Grumbling, RN

## 2022-10-11 NOTE — Telephone Encounter (Signed)
Attempted to call X2, left generic voicemail.  Dorris Singh, MD  Family Medicine Teaching Service

## 2022-10-18 ENCOUNTER — Emergency Department (HOSPITAL_COMMUNITY): Payer: Medicaid Other

## 2022-10-18 ENCOUNTER — Other Ambulatory Visit: Payer: Self-pay

## 2022-10-18 ENCOUNTER — Encounter (HOSPITAL_COMMUNITY): Payer: Self-pay | Admitting: Emergency Medicine

## 2022-10-18 ENCOUNTER — Emergency Department (HOSPITAL_COMMUNITY)
Admission: EM | Admit: 2022-10-18 | Discharge: 2022-10-19 | Disposition: A | Discharge status: Home / Self Care | Payer: Medicaid Other | Attending: Emergency Medicine | Admitting: Emergency Medicine

## 2022-10-18 ENCOUNTER — Encounter (HOSPITAL_COMMUNITY): Payer: Self-pay

## 2022-10-18 ENCOUNTER — Ambulatory Visit (HOSPITAL_COMMUNITY)
Admission: EM | Admit: 2022-10-18 | Discharge: 2022-10-18 | Disposition: A | Discharge status: Home / Self Care | Payer: Medicaid Other | Attending: Emergency Medicine | Admitting: Emergency Medicine

## 2022-10-18 DIAGNOSIS — K2100 Gastro-esophageal reflux disease with esophagitis, without bleeding: Secondary | ICD-10-CM | POA: Diagnosis not present

## 2022-10-18 DIAGNOSIS — R0789 Other chest pain: Secondary | ICD-10-CM | POA: Diagnosis present

## 2022-10-18 LAB — BASIC METABOLIC PANEL
Anion gap: 15 (ref 5–15)
BUN: 8 mg/dL (ref 6–20)
CO2: 22 mmol/L (ref 22–32)
Calcium: 10.7 mg/dL — ABNORMAL HIGH (ref 8.9–10.3)
Chloride: 101 mmol/L (ref 98–111)
Creatinine, Ser: 0.66 mg/dL (ref 0.44–1.00)
GFR, Estimated: >60 mL/min (ref >60)
Glucose, Bld: 113 mg/dL — ABNORMAL HIGH (ref 70–99)
Potassium: 3.8 mmol/L (ref 3.5–5.1)
Sodium: 138 mmol/L (ref 135–145)

## 2022-10-18 LAB — CBC
HCT: 36.3 % (ref 36.0–46.0)
Hemoglobin: 11.4 g/dL — ABNORMAL LOW (ref 12.0–15.0)
MCH: 23.4 pg — ABNORMAL LOW (ref 26.0–34.0)
MCHC: 31.4 g/dL (ref 30.0–36.0)
MCV: 74.4 fL — ABNORMAL LOW (ref 80.0–100.0)
Platelets: 240 10*3/uL (ref 150–400)
RBC: 4.88 MIL/uL (ref 3.87–5.11)
RDW: 14.5 % (ref 11.5–15.5)
WBC: 6.2 10*3/uL (ref 4.0–10.5)
nRBC: 0 % (ref 0.0–0.2)

## 2022-10-18 LAB — TROPONIN I (HIGH SENSITIVITY)
Troponin I (High Sensitivity): <2 ng/L (ref <18)
Troponin I (High Sensitivity): <2 ng/L (ref <18)

## 2022-10-18 LAB — I-STAT BETA HCG BLOOD, ED (MC, WL, AP ONLY): I-stat hCG, quantitative: <5 m[IU]/mL (ref <5)

## 2022-10-18 MED ORDER — LIDOCAINE VISCOUS HCL 2 % MT SOLN
15.0000 mL | Freq: Once | OROMUCOSAL | Status: AC
  Administered 2022-10-18: 15 mL via ORAL

## 2022-10-18 MED ORDER — ALUM & MAG HYDROXIDE-SIMETH 200-200-20 MG/5ML PO SUSP
ORAL | Status: AC
  Filled 2022-10-18: qty 30

## 2022-10-18 MED ORDER — LIDOCAINE VISCOUS HCL 2 % MT SOLN
OROMUCOSAL | Status: AC
  Filled 2022-10-18: qty 15

## 2022-10-18 MED ORDER — ALUM & MAG HYDROXIDE-SIMETH 200-200-20 MG/5ML PO SUSP
30.0000 mL | Freq: Once | ORAL | Status: AC
  Administered 2022-10-18: 30 mL via ORAL

## 2022-10-18 MED ORDER — PANTOPRAZOLE SODIUM 20 MG PO TBEC: 20.0000 mg | DELAYED_RELEASE_TABLET | Freq: Every day | ORAL | 2 refills | Status: DC

## 2022-10-18 NOTE — ED Provider Triage Note (Signed)
Emergency Medicine Provider Triage Evaluation Note  Kathy Richards , a 31 y.o. female  was evaluated in triage.  Pt complains of chest pain.  Started this morning, it substernal in between her breast.  No radiation.  No nausea or vomiting..  Review of Systems  Per HPI  Physical Exam  BP (!) 147/94 (BP Location: Right Arm)   Pulse 98   Temp 98.4 F (36.9 C) (Oral)   Resp 20   LMP  (LMP Unknown)   SpO2 100%  Gen:   Awake, no distress   Resp:  Normal effort  MSK:   Moves extremities without difficulty  Other:  Mildly tachycardic with regular rhythm.  Lungs are clear to auscultation, abdomen is soft nontender  Medical Decision Making  Medically screening exam initiated at 8:05 PM.  Appropriate orders placed.  Kathy Richards was informed that the remainder of the evaluation will be completed by another provider, this initial triage assessment does not replace that evaluation, and the importance of remaining in the ED until their evaluation is complete.     Sherrill Raring, PA-C 10/18/22 2005

## 2022-10-18 NOTE — ED Triage Notes (Signed)
Pt reports mid sternal chest pain that started this morning. Denies any other symptoms at this time.

## 2022-10-18 NOTE — ED Triage Notes (Signed)
Pt is here for chest pain since this morning

## 2022-10-18 NOTE — Discharge Instructions (Signed)
Please take the medication once daily.  Call either of the gastroenterologist (stomach specialist) offices and please make an appointment as soon as able.  Please go to the emergency department if symptoms worsen.

## 2022-10-18 NOTE — ED Provider Notes (Signed)
[place]    CSN: [id number] Arrival date & time: 10/18/22  1438     History   Chief Complaint Chief Complaint  Patient presents with   Sore Throat    HPI Kathy Richards is a 31 y.o. female.  Medical interpretor used for this encounter Presents with pain from throat to sternum Originally seen in triage presenting with chest pain but patient describes pain down the esophagus Denies shortness of breath Pain worse when laying flat  Does not notice association with food  No fever No nausea, vomiting, hematemesis, abdominal pain, diarrhea/constipation  No history of reflux, does not take any chronic medicine, no recent travel No sick contacts No heart problems  Past Medical History:  Diagnosis Date   Microcytic anemia 11/21/2020   Obesity     Patient Active Problem List   Diagnosis Date Noted   Adnexal cyst 04/27/2022   Pelvic pain 04/27/2022   TB lung, 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 Authorizing Provider  pantoprazole (PROTONIX) 20 MG tablet Take 1 tablet (20 mg total) by mouth daily. 10/18/22  Yes Helem Reesor, Wells Guiles, PA-C  ibuprofen (ADVIL) 800 MG tablet Take 1 tablet (800 mg total) by mouth every 8 (eight) hours as needed for up to 30 doses for fever, headache, mild pain or moderate pain. Patient not taking: Reported on 09/07/2022 04/27/22   Lyndee Hensen, DO  naproxen (NAPROSYN) 500 MG tablet Take 1 tablet  (500 mg total) by mouth 2 (two) times daily with a meal. 09/07/22   Martyn Malay, MD    Family History Family History  Problem Relation Age of Onset   HIV Mother    Hypertension Father     Social History Social History   Tobacco Use   Smoking status: Never   Smokeless tobacco: Never  Substance Use Topics   Alcohol use: Not Currently   Drug use: Never     Allergies   Patient has no known allergies.   Review of Systems Review of Systems  Per HPI  Physical Exam Triage Vital Signs ED Triage Vitals  Enc Vitals Group     BP 10/18/22 1455 (!) 149/81     Pulse Rate 10/18/22 1455 99     Resp 10/18/22 1455 16     Temp 10/18/22 1455 99 F (37.2 C)     Temp Source 10/18/22 1455 Oral     SpO2 10/18/22 1455 98 %     Weight --      Height --      Head Circumference --      Peak Flow --      Pain Score 10/18/22 1454 10  Pain Loc --      Pain Edu? --      Excl. in GC? --    No data found.  Updated Vital Signs BP (!) 149/81 (BP Location: Right Arm)   Pulse 99   Temp 99 F (37.2 C) (Oral)   Resp 16   LMP  (LMP Unknown)   SpO2 98%    Physical Exam Vitals and nursing note reviewed.  Constitutional:      Appearance: She is not toxic-appearing or diaphoretic.     Comments: Appears uncomfortable   HENT:     Mouth/Throat:     Mouth: Mucous membranes are moist.     Pharynx: Oropharynx is clear. No oropharyngeal exudate or posterior oropharyngeal erythema.  Eyes:     General: No scleral icterus.    Conjunctiva/sclera: Conjunctivae normal.  Neck:     Thyroid: No thyroid mass or thyromegaly.     Trachea: Phonation normal. No tracheal tenderness or tracheal deviation.  Cardiovascular:     Rate and Rhythm: Normal rate and regular rhythm.     Pulses: Normal pulses.     Heart sounds: Normal heart sounds.  Pulmonary:     Effort: Pulmonary effort is normal.     Breath sounds: Normal breath sounds.  Chest:       Comments: Some chest wall tenderness to  palpation. Pain is described in area noted. Abdominal:     Tenderness: There is no abdominal tenderness. There is no guarding or rebound.  Musculoskeletal:     Cervical back: Normal range of motion. No tenderness.  Lymphadenopathy:     Cervical: No cervical adenopathy.  Skin:    General: Skin is warm and dry.     Coloration: Skin is not jaundiced.  Neurological:     Mental Status: She is alert and oriented to person, place, and time.    UC Treatments / Results  Labs (all labs ordered are listed, but only abnormal results are displayed) Labs Reviewed - No data to display  EKG  Radiology No results found.  Procedures Procedures  Medications Ordered in UC Medications  alum & mag hydroxide-simeth (MAALOX/MYLANTA) 200-200-20 MG/5ML suspension 30 mL (30 mLs Oral Given 10/18/22 1511)    And  lidocaine (XYLOCAINE) 2 % viscous mouth solution 15 mL (15 mLs Oral Given 10/18/22 1511)    Initial Impression / Assessment and Plan / UC Course  I have reviewed the triage vital signs and the nursing notes.  Pertinent labs & imaging results that were available during my care of the patient were reviewed by me and considered in my medical decision making (see chart for details).  EKG sinus rhythm, ventricular rate 86 bpm, mild 1st degree AV block. Stable compared to previous reading  GI cocktail Maalox with lidocaine given, patient with improvement in pain but still feeling the discomfort Discussed possibility of reflux causing symptoms Low concern for acute cardiac abnormality. No signs of esophageal rupture or other emergent condition. Stable vitals.   She reports pain worse at night when laying flat and I recommend propping up. Protonix 20 mg once daily. Recommend close follow up with GI, may need endoscopy especially given patient discomfort Strict ED precautions for worsening symptoms. Patient agrees to plan.  Final Clinical Impressions(s) / UC Diagnoses   Final diagnoses:   Gastroesophageal reflux disease with esophagitis without hemorrhage     Discharge Instructions      Please take the medication once daily.  Call either of the gastroenterologist (stomach specialist) offices  and please make an appointment as soon as able.  Please go to the emergency department if symptoms worsen.     ED Prescriptions     Medication Sig Dispense Auth. Provider   pantoprazole (PROTONIX) 20 MG tablet Take 1 tablet (20 mg total) by mouth daily. 30 tablet Avianah Pellman, Wells Guiles, PA-C      PDMP not reviewed this encounter.   Daniell Mancinas, Wells Guiles, [place] 10/18/22 1655

## 2022-10-19 LAB — D-DIMER, QUANTITATIVE: D-Dimer, Quant: 0.45 ug/mL-FEU (ref 0.00–0.50)

## 2022-10-19 MED ORDER — SUCRALFATE 1 GM/10ML PO SUSP
1.0000 g | Freq: Three times a day (TID) | ORAL | Status: DC
  Administered 2022-10-19: 1 g via ORAL
  Filled 2022-10-19 (×2): qty 10

## 2022-10-19 MED ORDER — SUCRALFATE 1 G PO TABS: 1.0000 g | ORAL_TABLET | Freq: Three times a day (TID) | ORAL | 0 refills | Status: DC

## 2022-10-19 MED ORDER — KETOROLAC TROMETHAMINE 15 MG/ML IJ SOLN
15.0000 mg | Freq: Once | INTRAMUSCULAR | Status: AC
  Administered 2022-10-19: 15 mg via INTRAVENOUS
  Filled 2022-10-19: qty 1

## 2022-10-19 MED ORDER — FAMOTIDINE IN NACL 20-0.9 MG/50ML-% IV SOLN
20.0000 mg | Freq: Once | INTRAVENOUS | Status: AC
  Administered 2022-10-19: 20 mg via INTRAVENOUS
  Filled 2022-10-19: qty 50

## 2022-10-19 NOTE — ED Notes (Signed)
Patient complaining of worsening cp and feeling like she is going to pass out. Vitals updated and taken to triage for updated EKG. Ekg given to Dr. Roxanne Mins.

## 2022-10-19 NOTE — Discharge Instructions (Signed)
In addition to the Protonix which was prescribed for you at urgent care, please obtain and take the Carafate tablets.  The commendation of these medications should improve your condition.  If you develop new, or concerning changes do not hesitate to return here.  Otherwise, please be sure to follow-up with our gastroenterology colleagues.  If the office does not call you for a visit, please contact them.

## 2022-10-19 NOTE — ED Provider Notes (Signed)
[facility] EMERGENCY DEPARTMENT Provider Note   CSN: [id number] Arrival date & time: 10/18/22  1924     History  Chief Complaint  Patient presents with   Chest Pain    Kathy Richards is a 31 y.o. female.  HPI Patient presents with her mother.  Presents due to chest pain.  Pain is sternal, persistent in spite of GI medications provided 2 days ago when she went to urgent care.  She is generally well takes no medication regularly, stop taking birth control sometime ago. Pain began earlier this week, is nonradiating, sternal, tight, squeezing, without appreciable dyspnea, syncope, fever, no abdominal pain.    Home Medications Prior to Admission medications   Medication Sig Start Date End Date Taking? Authorizing Provider  sucralfate (CARAFATE) 1 g tablet Take 1 tablet (1 g total) by mouth 4 (four) times daily -  with meals and at bedtime. 10/19/22  Yes Carmin Muskrat, MD  pantoprazole (PROTONIX) 20 MG tablet Take 1 tablet (20 mg total) by mouth daily. 10/18/22   Rising, Wells Guiles, PA-C      Allergies    Patient has no known allergies.    Review of Systems   Review of Systems  All other systems reviewed and are negative.   Physical Exam Updated Vital Signs BP 116/65   Pulse 82   Temp 97.8 F (36.6 C) (Oral)   Resp (!) 29   Ht 5\' 8"  (1.727 m)   Wt 108.9 kg   LMP  (LMP Unknown)   SpO2 100%   BMI 36.49 kg/m  Physical Exam Vitals and nursing note reviewed.  Constitutional:      General: She is not in acute distress.    Appearance: She is well-developed.  HENT:     Head: Normocephalic and atraumatic.  Eyes:     Conjunctiva/sclera: Conjunctivae normal.  Cardiovascular:     Rate and Rhythm: Normal rate and regular rhythm.  Pulmonary:     Effort: Pulmonary effort is normal. No respiratory distress.     Breath sounds: Normal breath sounds. No stridor.  Chest:     Comments: No reproducible pain with palpation Abdominal:     General: There is  no distension.  Skin:    General: Skin is warm and dry.  Neurological:     Mental Status: She is alert and oriented to person, place, and time.     Cranial Nerves: No cranial nerve deficit.  Psychiatric:        Mood and Affect: Mood normal.     ED Results / Procedures / Treatments   Labs (all labs ordered are listed, but only abnormal results are displayed) Labs Reviewed  BASIC METABOLIC PANEL - Abnormal; Notable for the following components:      Result Value   Glucose, Bld 113 (*)    Calcium 10.7 (*)    All other components within normal limits  CBC - Abnormal; Notable for the following components:   Hemoglobin 11.4 (*)    MCV 74.4 (*)    MCH 23.4 (*)    All other components within normal limits  D-DIMER, QUANTITATIVE  I-STAT BETA HCG BLOOD, ED ([facility], [facility], AP ONLY)  TROPONIN I (HIGH SENSITIVITY)  TROPONIN I (HIGH SENSITIVITY)    EKG EKG Interpretation  Date/Time:  Friday October 19 2022 04:09:12 EDT Ventricular Rate:  90 PR Interval:  222 QRS Duration: 84 QT Interval:  348 QTC Calculation: 425 R Axis:   86 Text Interpretation: Sinus rhythm with 1st degree  A-V block Otherwise normal ECG When compared with ECG of 18-Oct-2022 19:22, No significant change was found Confirmed by Delora Fuel ([id number]) on 10/19/2022 4:14:50 AM  Radiology DG Chest 2 View  Result Date: 10/18/2022 CLINICAL DATA:  Chest pain EXAM: CHEST - 2 VIEW COMPARISON:  01/20/2021 FINDINGS: The heart size and mediastinal contours are within normal limits. Both lungs are clear. The visualized skeletal structures are unremarkable. IMPRESSION: No active cardiopulmonary disease. Electronically Signed   By: Lucienne Capers M.D.   On: 10/18/2022 20:42    Procedures Procedures    Medications Ordered in ED Medications  sucralfate (CARAFATE) 1 GM/10ML suspension 1 g (1 g Oral Given 10/19/22 1000)  famotidine (PEPCID) IVPB 20 mg premix (0 mg Intravenous Stopped 10/19/22 1000)  ketorolac (TORADOL) 15 MG/ML  injection 15 mg (15 mg Intravenous Given 10/19/22 0931)    ED Course/ Medical Decision Making/ A&P This patient with a Hx of no medical problems presents to the ED for concern of chest pain, this involves an extensive number of treatment options, and is a complaint that carries with it a high risk of complications and morbidity.    The differential diagnosis includes ACS PE pneumonia musculoskeletal gastroesophageal etiology   Social Determinants of Health:  Patient speaks English, but is primarily [place] speaking, arrived here from [place] 2 years ago.  No recent travel  Additional history obtained:  Additional history and/or information obtained from mother at bedside and urgent care, notable for notes reviewed including trial of GI medication at urgent care   After the initial evaluation, orders, including: Labs from triage, additional of labs after my primary exam were initiated.   Patient placed on Cardiac and Pulse-Oximetry Monitors. The patient was maintained on a cardiac monitor.  The cardiac monitored showed an rhythm of 85 sinus normal The patient was also maintained on pulse oximetry. The readings were typically 100% room air normal   On repeat evaluation of the patient stayed the same  Lab Tests:  I personally interpreted labs.  The pertinent results include: 2 normal troponin, D-dimer normal, patient is not pregnant, labs unremarkable  Imaging Studies ordered:  I independently visualized and interpreted imaging which showed unremarkable chest x-ray I agree with the radiologist interpretation   Dispostion / Final MDM:  After consideration of the diagnostic results and the patient's response to treatment, this adult female without medical problems presents with sternal and epigastric discomfort.  Low heart score, minimal risk profile for PE, ACS, reassuring 2 normal troponin, no evidence of bacteremia, sepsis, pneumomediastinum.  Some suspicion for gastroesophageal  etiology, patient started medication both at urgent care and with additional meds here, discharged to follow-up with GI.  Final Clinical Impression(s) / ED Diagnoses Final diagnoses:  Atypical chest pain    Rx / DC Orders ED Discharge Orders          Ordered    sucralfate (CARAFATE) 1 g tablet  3 times daily with meals & bedtime       Note to Pharmacy: Take for one week   10/19/22 1130              Carmin Muskrat, MD 10/19/22 1132

## 2022-10-22 ENCOUNTER — Ambulatory Visit (INDEPENDENT_AMBULATORY_CARE_PROVIDER_SITE_OTHER): Payer: Medicaid Other | Admitting: Family Medicine

## 2022-10-22 ENCOUNTER — Encounter: Payer: Self-pay | Admitting: Family Medicine

## 2022-10-22 VITALS — BP 130/80 | HR 83 | Temp 97.7°F | Ht 68.0 in | Wt 249.4 lb

## 2022-10-22 DIAGNOSIS — E669 Obesity, unspecified: Secondary | ICD-10-CM

## 2022-10-22 DIAGNOSIS — Z23 Encounter for immunization: Secondary | ICD-10-CM

## 2022-10-22 NOTE — Progress Notes (Signed)
    SUBJECTIVE:   CHIEF COMPLAINT: back pain HPI:  The patient speaks Swahili as their primary language.  An interpreter was used for the entire visit.   Unknown Ahmed is a 31 y.o.  with history notable for obesity presenting for chest. She is joined by her mother.   Illness Patient reports 5 days of chest pain, going to throat and back. Associated with hiccups. Was seen in the ED twice for this. EKG showed first degree AV block. Negative D-dimer, troponins, and chest x-ray. Has been using carafate but not PPI with some relief. Denies stressors or anxiety at home but does not want to return to work until next week. Denies pressure, dyspnea, LE edema, or trauma. She does report the carafate helps when she takes it. Reports her back pain is improved.   PERTINENT  PMH / PSH/Family/Social History : obesity, social stressors, chronic back pain  OBJECTIVE:   BP 130/80   Pulse 83   Temp 97.7 F (36.5 C)   Ht 5\' 8"  (1.727 m)   Wt 249 lb 6.4 oz (113.1 kg)   LMP  (LMP Unknown)   SpO2 100%   BMI 37.92 kg/m   Today's weight:  Last Weight  Most recent update: 10/22/2022 11:10 AM    Weight  113.1 kg (249 lb 6.4 oz)            Review of prior weights: Filed Weights   10/22/22 1110  Weight: 249 lb 6.4 oz (113.1 kg)     Cardiac: Regular rate and rhythm. Normal S1/S2. No murmurs, rubs, or gallops appreciated. Lungs: Clear bilaterally to ascultation.  Abdomen: Normoactive bowel sounds. No rebound or guarding.  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

## 2022-10-22 NOTE — Patient Instructions (Signed)
It was wonderful to see you today.  Please bring ALL of your medications with you to every visit.   Today we talked about:   - Please start pantoprazole    - You can return to work next week    - Tafadhali anza pantoprazole   - Unaweza kurudi kazini wiki ijayo   Please follow up in 1 months   Thank you for choosing [facility].   Please call [phone] with any questions about today's appointment.  Please be sure to schedule follow up at the front  desk before you leave today.   Dorris Singh, MD  Family Medicine

## 2022-10-22 NOTE — Assessment & Plan Note (Signed)
Lipids and A1C today

## 2022-10-23 ENCOUNTER — Encounter: Payer: Self-pay | Admitting: Family Medicine

## 2022-10-23 LAB — LIPID PANEL
Chol/HDL Ratio: 3.9 ratio (ref 0.0–4.4)
Cholesterol, Total: 205 mg/dL — ABNORMAL HIGH (ref 100–199)
HDL: 52 mg/dL
LDL Chol Calc (NIH): 139 mg/dL — ABNORMAL HIGH (ref 0–99)
Triglycerides: 79 mg/dL (ref 0–149)
VLDL Cholesterol Cal: 14 mg/dL (ref 5–40)

## 2022-10-23 LAB — HEMOGLOBIN A1C
Est. average glucose Bld gHb Est-mCnc: 114 mg/dL
Hgb A1c MFr Bld: 5.6 % (ref 4.8–5.6)

## 2022-11-08 ENCOUNTER — Ambulatory Visit: Payer: Medicaid Other | Admitting: Student

## 2022-11-08 ENCOUNTER — Encounter: Payer: Self-pay | Admitting: Student

## 2022-11-08 ENCOUNTER — Ambulatory Visit (HOSPITAL_COMMUNITY)
Admission: RE | Admit: 2022-11-08 | Discharge: 2022-11-08 | Disposition: A | Discharge status: Home / Self Care | Payer: Medicaid Other | Source: Ambulatory Visit | Attending: Family Medicine | Admitting: Family Medicine

## 2022-11-08 VITALS — BP 116/67 | HR 73 | Temp 98.2°F | Ht 68.0 in | Wt 250.4 lb

## 2022-11-08 DIAGNOSIS — R0609 Other forms of dyspnea: Secondary | ICD-10-CM | POA: Diagnosis present

## 2022-11-08 NOTE — Progress Notes (Addendum)
  SUBJECTIVE:   CHIEF COMPLAINT / HPI:   Swahili interpreter utilized throughout interaction   She reports bilaterally lower extremity weakness and shaking in the legs, like she is unsteady onging for 2 weeks in total. When she has to use the stairs or walking, she feels short of breath.   She reports that this started after starting Sucralfate from recent ED visit for epigastric pain. At that time, she had an EKG that was relatively normal, Trop/D-dimer/CBC/BMP normal. CXR unremarkable at that time.   Difficulty breathing with prolonged ambulation.   No cough or URI symptoms, without any recent viral illness   She does feel dizzy intermittently; reports that this occurs when the weakness in her legs begins   She has not had syncopal episode or trauma.   PERTINENT  PMH / PSH: Latent TB, microcytic anemia   Past Medical History:  Diagnosis Date   Microcytic anemia 11/21/2020   Obesity     OBJECTIVE:  BP 116/67   Pulse 73   Temp 98.2 F (36.8 C) (Oral)   Ht 5\' 8"  (1.727 m)   Wt 250 lb 6 oz (113.6 kg)   LMP 10/26/2022   SpO2 100%   BMI 38.07 kg/m  Physical Exam Vitals reviewed.  Constitutional:      Appearance: Normal appearance.  HENT:     Head: Normocephalic.     Nose: Nose normal.     Mouth/Throat:     Mouth: Mucous membranes are moist.  Eyes:     Conjunctiva/sclera: Conjunctivae normal.  Cardiovascular:     Rate and Rhythm: Normal rate and regular rhythm.  Pulmonary:     Effort: Pulmonary effort is normal.     Breath sounds: Normal breath sounds.  Musculoskeletal:     Cervical back: Normal range of motion.  Skin:    Capillary Refill: Capillary refill takes less than 2 seconds.  Neurological:     General: No focal deficit present.     Mental Status: She is alert.  Psychiatric:        Mood and Affect: Mood normal.        Behavior: Behavior normal.      ASSESSMENT/PLAN:  DOE (dyspnea on exertion) Assessment & Plan: I do not believe these symptoms are  related to the new medication that she was started in the ED.  There may be a component of dehydration with her symptoms, instructed to continue remaining hydrated throughout the day.  Reassuringly, this patient has had a normal EKG previously and today in addition to unremarkable Trop/D-dimer/CBC/BMP in the last 2 weeks.  Chest x-ray was also unremarkable.  TSH obtained previously was normal.  Given these findings, reassuring against cardiopulmonary etiology.  It is also possible that the patient is experiencing psychosomatic complaints that are contributing to these symptoms.  Overall, she is hemodynamically stable and has recently been worked up to completion.  I do not feel the need to repeat labs today.  Instructed the patient to continue with hydration and a symptom diary.  She is to follow-up with her PCP in 2 weeks.  Orders: -     EKG 12-Lead   Return in about 2 weeks (around 11/22/2022) for symptom diary and dizziness follow up. 11/24/2022, MD 11/12/2022, 3:50 PM PGY-2, [facility]

## 2022-11-08 NOTE — Patient Instructions (Addendum)
It was great to see you today! Thank you for choosing [facility] for your primary care. Kathy Richards was seen for follow up.  It is reassuring that you have a normal heart and lungs  Keep a symptom diary and return in 2 weeks  Stay hydrated and please drink water everyday  Return in 2 weeks   If you haven't already, sign up for My Chart to have easy access to your labs results, and communication with your primary care physician.  I recommend that you always bring your medications to each appointment as this makes it easy to ensure you are on the correct medications and helps [place] not miss refills when you need them. Call the clinic at [phone] if your symptoms worsen or you have any concerns.  You should return to our clinic Return in about 2 weeks (around 11/22/2022) for symptom diary and dizziness follow up. Please arrive 15 minutes before your appointment to ensure smooth check in process.  We appreciate your efforts in making this happen.  Thank you for allowing me to participate in your care, Alfredo Martinez, MD 11/08/2022, 12:02 PM PGY-2, [facility]

## 2022-11-09 ENCOUNTER — Encounter: Payer: Self-pay | Admitting: Student

## 2022-11-09 DIAGNOSIS — R0609 Other forms of dyspnea: Secondary | ICD-10-CM | POA: Insufficient documentation

## 2022-11-09 NOTE — Assessment & Plan Note (Signed)
I do not believe these symptoms are related to the new medication that she was started in the ED.  There may be a component of dehydration with her symptoms, instructed to continue remaining hydrated throughout the day.  Reassuringly, this patient has had a normal EKG previously and today in addition to unremarkable Trop/D-dimer/CBC/BMP in the last 2 weeks.  Chest x-ray was also unremarkable.  TSH obtained previously was normal.  Given these findings, reassuring against cardiopulmonary etiology.  It is also possible that the patient is experiencing psychosomatic complaints that are contributing to these symptoms.  Overall, she is hemodynamically stable and has recently been worked up to completion.  I do not feel the need to repeat labs today.  Instructed the patient to continue with hydration and a symptom diary.  She is to follow-up with her PCP in 2 weeks.

## 2022-11-30 ENCOUNTER — Encounter: Payer: Self-pay | Admitting: Family Medicine

## 2022-11-30 ENCOUNTER — Other Ambulatory Visit: Payer: Self-pay

## 2022-11-30 ENCOUNTER — Ambulatory Visit (INDEPENDENT_AMBULATORY_CARE_PROVIDER_SITE_OTHER): Payer: Medicaid Other | Admitting: Family Medicine

## 2022-11-30 VITALS — BP 134/74 | HR 97 | Wt 254.6 lb

## 2022-11-30 DIAGNOSIS — R1013 Epigastric pain: Secondary | ICD-10-CM

## 2022-11-30 MED ORDER — PANTOPRAZOLE SODIUM 20 MG PO TBEC: 20.0000 mg | DELAYED_RELEASE_TABLET | Freq: Every day | ORAL | 2 refills | Status: DC

## 2022-11-30 NOTE — Progress Notes (Signed)
    SUBJECTIVE:   CHIEF COMPLAINT: acid reflux  HPI:   The patient speaks Swahili as their primary language.  An interpreter was used for the entire visit.   Kathy Richards is a 31 y.o.  with history notable for obesity presenting for follow up of acid reflux.  She reports she has continued acid in her stomach. She describes this as burning when she lies flat after a meal. No nausea, weight loss, vomiting, or chest pain. She is not taking any medications currently.  The patient reports many worries about her family. She was recently let go from her job. She worries about her family and Kathy Richards in [place]. She denies sadness but reports worries and poor slep.     PERTINENT  PMH / PSH/Family/Social History : obesity  OBJECTIVE:   BP 134/74   Pulse 97   Wt 254 lb 9.6 oz (115.5 kg)   LMP 10/26/2022   SpO2 100%   BMI 38.71 kg/m   Today's weight:  Last Weight  Most recent update: 11/30/2022 11:30 AM    Weight  115.5 kg (254 lb 9.6 oz)            Review of prior weights: [employer]   11/30/22 1130  Weight: 254 lb 9.6 oz (115.5 kg)     Cardiac: Regular rate and rhythm. Normal S1/S2. No murmurs, rubs, or gallops appreciated. Lungs: Clear bilaterally to ascultation.  Abdomen: Normoactive bowel sounds. No tenderness to deep or light palpation. No rebound or guarding.  Mild epigastric pain.  Psych: Pleasant and appropriate    ASSESSMENT/PLAN:   Dyspepsia - Worsened off of PPI therapy - Will test for H. Pylori given risk factors, restart PPI thereafter  - No worrisome features warranting EGD  Acute stress reaction - Discussed options, counseling - Referral to [facility] counseling - Sent fax to Kathy Richards at [facility]   Follow up with PCP to review H. Pylori results, consider SSRI.        Terisa Starr, MD  Family Medicine Teaching Service  [facility]

## 2022-11-30 NOTE — Patient Instructions (Addendum)
It was wonderful to see you today.  Please bring ALL of your medications with you to every visit.   Today we talked about:   - We will test you for a stomach infection  - I sent in the medication you can take for your acid  - I will call you with results   - Tutakujaribu kwa maambukizi ya tumbo  - Nilituma dawa Marlyne Beards kuchukua kwa asidi yako  - Nitakuita na matokeo   Please follow up in 1 months   Thank you for choosing [facility].   Please call [phone] with any questions about today's appointment.  Please be sure to schedule follow up at the front  desk before you leave today.   Terisa Starr, MD  Family Medicine

## 2022-12-02 ENCOUNTER — Telehealth: Payer: Self-pay | Admitting: Family Medicine

## 2022-12-02 DIAGNOSIS — A048 Other specified bacterial intestinal infections: Secondary | ICD-10-CM

## 2022-12-02 DIAGNOSIS — B9681 Helicobacter pylori [H. pylori] as the cause of diseases classified elsewhere: Secondary | ICD-10-CM

## 2022-12-02 LAB — H. PYLORI BREATH TEST: H pylori Breath Test: POSITIVE — AB

## 2022-12-02 MED ORDER — BISMUTH/METRONIDAZ/TETRACYCLIN 140-125-125 MG PO CAPS: 3.0000 | ORAL_CAPSULE | Freq: Four times a day (QID) | ORAL | 0 refills | Status: DC

## 2022-12-02 NOTE — Telephone Encounter (Signed)
Called patient with Swahili interpreter. Discussed positive H. Pylori results. Discussed this is very treatable We discussed that this does take many pills taken properly at the right times. All questions answered. She will bring pill pack to next visit to discuss how to take.  Kathy Starr, MD  Family Medicine Teaching Service

## 2022-12-04 ENCOUNTER — Ambulatory Visit: Payer: Medicaid Other

## 2022-12-04 ENCOUNTER — Other Ambulatory Visit: Payer: Self-pay | Admitting: *Deleted

## 2022-12-04 DIAGNOSIS — A048 Other specified bacterial intestinal infections: Secondary | ICD-10-CM

## 2022-12-04 MED ORDER — METRONIDAZOLE 250 MG PO TABS: 250.0000 mg | ORAL_TABLET | Freq: Four times a day (QID) | ORAL | 0 refills | Status: DC

## 2022-12-04 MED ORDER — DOXYCYCLINE HYCLATE 100 MG PO TABS: 100.0000 mg | ORAL_TABLET | Freq: Two times a day (BID) | ORAL | 0 refills | Status: DC

## 2022-12-04 MED ORDER — BISMUTH SUBSALICYLATE 262 MG PO CHEW: 262.0000 mg | CHEWABLE_TABLET | Freq: Four times a day (QID) | ORAL | 0 refills | Status: DC

## 2022-12-04 MED ORDER — PANTOPRAZOLE SODIUM 20 MG PO TBEC: 20.0000 mg | DELAYED_RELEASE_TABLET | Freq: Two times a day (BID) | ORAL | 0 refills | Status: DC

## 2022-12-04 NOTE — Telephone Encounter (Signed)
Per pharmacy alternative needed. Rx sent in not covered by insurance. Please advise. [place] Bruna Potter, CMA

## 2022-12-04 NOTE — Telephone Encounter (Signed)
Separate medications to patient's pharmacy. She is to bring to next visit.  Terisa Starr, MD  Family Medicine Teaching Service

## 2022-12-12 ENCOUNTER — Telehealth: Payer: Self-pay

## 2022-12-12 NOTE — Telephone Encounter (Signed)
Please call patient back She will need to take these with bismuth--this can be purchased over the counter  She should STOP taking the medications until she has the bismuth   Please use interpreter to call patient.  Terisa Starr, MD  Family Medicine Teaching Service

## 2022-12-12 NOTE — Telephone Encounter (Signed)
Patient calls nurse line in regards to prescriptions sent over on 12/12.  Patient reports she has not been able to pick up all of them. She was unable to tell me which ones.   I called the pharmacy and they report all were picked up except for Bismuth. Pharmacy reports Bismuth is not covered by Medicaid.   Patient has started taking Doxycycline, Flagyl and Protonix.   Patient has a FU with PCP scheduled for 12/22.

## 2022-12-13 NOTE — Telephone Encounter (Signed)
Called Patient using [employer] ID # [id number].  There was no answer and no ability to leave a message from Dr. Manson Passey   Patient does have an upcoming appointment with PCP on 12/14/2022 and can receive instruction at visit.  Glennie Hawk, CMA

## 2022-12-13 NOTE — Telephone Encounter (Signed)
Called patient using [employer] ID# [phone].  There was no answer and no ability to leave a message.  Patient has appointment

## 2022-12-14 ENCOUNTER — Other Ambulatory Visit (HOSPITAL_COMMUNITY): Payer: Self-pay

## 2022-12-14 ENCOUNTER — Ambulatory Visit (INDEPENDENT_AMBULATORY_CARE_PROVIDER_SITE_OTHER): Payer: Medicaid Other | Admitting: Family Medicine

## 2022-12-14 ENCOUNTER — Encounter: Payer: Self-pay | Admitting: Family Medicine

## 2022-12-14 VITALS — BP 124/75 | HR 76 | Wt 257.2 lb

## 2022-12-14 DIAGNOSIS — A048 Other specified bacterial intestinal infections: Secondary | ICD-10-CM

## 2022-12-14 DIAGNOSIS — Z227 Latent tuberculosis: Secondary | ICD-10-CM | POA: Diagnosis not present

## 2022-12-14 MED ORDER — BISMUTH/METRONIDAZ/TETRACYCLIN 140-125-125 MG PO CAPS
3.0000 | ORAL_CAPSULE | Freq: Four times a day (QID) | ORAL | 0 refills | Status: DC
  Filled 2022-12-14: qty 120, 10d supply, fill #0

## 2022-12-14 MED ORDER — PANTOPRAZOLE SODIUM 20 MG PO TBEC
20.0000 mg | DELAYED_RELEASE_TABLET | Freq: Two times a day (BID) | ORAL | 0 refills | Status: DC
  Filled 2022-12-14 – 2022-12-21 (×2): qty 20, 10d supply, fill #0

## 2022-12-14 NOTE — Assessment & Plan Note (Addendum)
Pylera and PPI to [facility]  I will confirm cost/availability Will call her when ready for pick up She will bring medications to visit with pharmacy on 01/03/23--this visit is to go over how to take medication. She knows NOT to take any medication before that visit

## 2022-12-14 NOTE — Progress Notes (Signed)
    SUBJECTIVE:   CHIEF COMPLAINT: school vaccines  HPI:   Kathy Richards is a 31 y.o.  with history notable for H. Pylori, stress reaction, and obesity presenting for  follow up. The patient speaks Swahili as their primary language.  An interpreter was used for the entire visit.   The patient goes by Kathy Richards. She reports she is starting classes at [employer] on January 8. She reports she needs paperwork completed but does not have this with her today. She does have a history of latent Tb and per most recent records was not treated by [facility] (she declined).   The patient started her PPI (and possibly some other other medications?) but not bismuth for H. Pylori. Despite being listed on Medicaid formulary, Pylera was not covered. She is frustrated by the cost of the medications. Acid reflux has improved on PPI.   PERTINENT  PMH / PSH/Family/Social History : updated and reviewed   OBJECTIVE:   BP 124/75   Pulse 76   Wt 257 lb 3.2 oz (116.7 kg)   SpO2 100%   BMI 39.11 kg/m   Today's weight:  Last Weight  Most recent update: 12/14/2022 11:27 AM    Weight  116.7 kg (257 lb 3.2 oz)            Review of prior weights: Filed Weights   12/14/22 1126  Weight: 257 lb 3.2 oz (116.7 kg)     Cardiac: Regular rate and rhythm. Normal S1/S2. No murmurs, rubs, or gallops appreciated. Lungs: Clear bilaterally to ascultation.  Abdomen: Normoactive bowel sounds. No tenderness to deep or light palpation. No rebound or guarding.    Psych: Pleasant and appropriate    ASSESSMENT/PLAN:   TB lung, latent Called and left voicemail at [facility] Tb line to determine treatment status for latent Tb--suspect she will need to start treatment to start classes   H. pylori infection Pylera and PPI to [facility]  I will confirm cost/availability Will call her when ready for pick up She will bring medications to visit with pharmacy on 01/03/23--this visit is to go over how to take medication. She knows NOT to take  any medication before that visit    School form She will bring in paperwork to next visit Needs Hepatitis A vaccine--otherwise UTD on vaccine Her 12/26/22 appointment is extended time for this paperwork--please make sure she knows to NOT take her H. Pylori medications until they are reviewed with her at her 01/03/23 visit   HCM Declined COVID vaccine     Terisa Starr, MD  Family Medicine Teaching Service  [facility]

## 2022-12-14 NOTE — Patient Instructions (Addendum)
It was wonderful to see you today.  Please bring ALL of your medications with you to every visit.   Today we talked about:  - I will call you and let you know when your prescriptions are ready at the pharmacy - DO NOT TAKE YOUR MEDICATIONS WHEN YOU PICK THEM - Bring all of your medications to your next two visits - At your pharmacy visit, they will go over how to take all of the medications  - I will call the health department to see if you completed Tb treatment    - Nitakupigia simu na kukujulisha wakati maagizo yako yatakuwa tayari Milana Huntsman duka la dawa - USICHUKUE DAWA ZAKO WAKATI UNAZICHUKUA - Lete dawa zako zote kwa ziara zako mbili zinazofuata - Katika ziara yako ya duka la dawa, watapitia jinsi ya kutumia dawa zote  - Nitapiga simu kwa idara ya afya ili kuona ikiwa umemaliza matibabu ya Tb   Please follow up in 3 months   Thank you for choosing [facility].   Please call [phone] with any questions about today's appointment.  Please be sure to schedule follow up at the front  desk before you leave today.   Terisa Starr, MD  Family Medicine

## 2022-12-14 NOTE — Assessment & Plan Note (Signed)
Called and left voicemail at [facility] Tb line to determine treatment status for latent Tb--suspect she will need to start treatment to start classes

## 2022-12-21 ENCOUNTER — Other Ambulatory Visit (HOSPITAL_COMMUNITY): Payer: Self-pay

## 2022-12-21 ENCOUNTER — Telehealth: Payer: Self-pay | Admitting: Family Medicine

## 2022-12-21 NOTE — Telephone Encounter (Signed)
Called [facility] pharmacy. Cost of Pylera is $4.   Called patient to discuss with Swahili interpreter.  Discussed how to pick up medication and cost. She is NOT to take the medication but bring the medications with her to her next visit where we will review it with it her.  Has follow up with Dr. Edison Nasuti for school form (which she will bring) and Dr. Raymondo Band to review how to take Pylera + PPI.    Terisa Starr, MD  Family Medicine Teaching Service

## 2022-12-26 ENCOUNTER — Encounter: Payer: Self-pay | Admitting: Student

## 2022-12-26 ENCOUNTER — Other Ambulatory Visit (HOSPITAL_COMMUNITY): Payer: Self-pay

## 2022-12-26 ENCOUNTER — Ambulatory Visit (INDEPENDENT_AMBULATORY_CARE_PROVIDER_SITE_OTHER): Payer: Medicaid Other | Admitting: Student

## 2022-12-26 VITALS — BP 118/70 | HR 83 | Ht 68.0 in | Wt 255.0 lb

## 2022-12-26 DIAGNOSIS — Z227 Latent tuberculosis: Secondary | ICD-10-CM | POA: Diagnosis not present

## 2022-12-26 DIAGNOSIS — A048 Other specified bacterial intestinal infections: Secondary | ICD-10-CM | POA: Diagnosis present

## 2022-12-26 DIAGNOSIS — Z111 Encounter for screening for respiratory tuberculosis: Secondary | ICD-10-CM | POA: Diagnosis not present

## 2022-12-26 MED ORDER — OMEPRAZOLE 20 MG PO CPDR
20.0000 mg | DELAYED_RELEASE_CAPSULE | Freq: Every day | ORAL | 3 refills | Status: DC
  Filled 2022-12-26: qty 30, 30d supply, fill #0

## 2022-12-26 MED ORDER — OMEPRAZOLE 20 MG PO CPDR
20.0000 mg | DELAYED_RELEASE_CAPSULE | Freq: Two times a day (BID) | ORAL | 1 refills | Status: DC
  Filled 2022-12-26 – 2023-01-17 (×2): qty 60, 30d supply, fill #0

## 2022-12-26 NOTE — Assessment & Plan Note (Signed)
Patient brought her Pylera medication today. Spent a substantial amount of time with Dr. Valentina Lucks to go over treatment plan and discuss how to take her medication(See Dr. Graylin Shiver note). -Rx omeprazole 20 mg twice daily which patient will take with her pylera

## 2022-12-26 NOTE — Progress Notes (Signed)
    SUBJECTIVE:   CHIEF COMPLAINT / HPI:   Patient is a 32 year old female presenting today for TB screen Patient reports she's needing the TB screen because it's required for school Would prefer Quant gold test because she can't return in 2-3 days to have it read Previous postive for Latent TB Denies any fever, chills, night sweat, chronic cough or unexplained weight loss  PERTINENT  PMH / PSH: Reviewed   OBJECTIVE:   BP 118/70   Pulse 83   Ht 5\' 8"  (1.727 m)   Wt 255 lb (115.7 kg)   SpO2 100%   BMI 38.77 kg/m    Physical Exam General: Alert, well appearing, NAD Cardiovascular: RRR, No Murmurs, Normal S2/S2 Respiratory: CTAB, No wheezing or Rales Abdomen: No distension or tenderness  ASSESSMENT/PLAN:   H. pylori infection Patient brought her Pylera medication today. Spent a substantial amount of time with Dr. Valentina Lucks to go over treatment plan and discuss how to take her medication(See Dr. Graylin Shiver note). -Rx omeprazole 20 mg twice daily which patient will take with her pylera   TB Screen Patient with known history of latent TB who is asymptomatic today presenting for TB screening for school. -Ordered Quant gold test -Discussed with patient should her quant gold come back positive she would need a chest x-ray to determine if she has active vs latent TB -Discussed need for latent TB treatment, patient is amendable to tx. -Provided patient with phone number for [facility] for TB treatment -Return precaution discussed with patient which he verbalized understanding   Video interpreter Katharine Look was used for the entire encounter.   Alen Bleacher, MD [place]

## 2022-12-26 NOTE — Progress Notes (Signed)
Asked by Dr. Adah Salvage to provide education on Pylera Tx for H-pylori.  Patient has medication supply with her today.   She did not have PPI therapy.  New prescription for omeprazole 20mg  BID was provided.  Patient was instructed to pick-up and to take Twice daily in addition to Pylera.   Patient educated on purpose, proper use and potential adverse effects of tongue discoloration and need to avoid dairy foods like mike and cheese .  Following instruction patient verbalized understanding of treatment plan.  She was able to describe instructions for taking medications following instruction.   Planned visit for 01/03/2023 in Rx clinic was cancelled as education was completed today.

## 2022-12-26 NOTE — Patient Instructions (Addendum)
It was wonderful to see you today.  Please bring ALL of your medications with you to every visit.    Today our pharmacy team spoke to you about how to take your Pylera for H. Pyori infection in your stomach.  I sent in medication you will take twice daily together with the Pylera. Please pick it up at the [facility] cone pharmacy   We collected blood sample today to test for your TB as requested. Being that you previously tested positive for latent TB, this might come back positive and if it's positive youy would need follow up chest Xray to make sure the TB is not active.   For treatment call the [facility] department. Their number is provided below. Tuberculosis (TB): [phone]    Thank you for choosing [facility].   Please call [phone] with any questions about today's appointment.  Please be sure to schedule follow up at the front  desk before you leave today.    Family Medicine

## 2022-12-29 LAB — QUANTIFERON-TB GOLD PLUS
QuantiFERON Mitogen Value: >10 IU/mL
QuantiFERON Nil Value: 0.17 IU/mL
QuantiFERON TB1 Ag Value: 3.67 IU/mL
QuantiFERON TB2 Ag Value: 2.95 IU/mL
QuantiFERON-TB Gold Plus: POSITIVE — AB

## 2023-01-03 ENCOUNTER — Ambulatory Visit: Payer: Medicaid Other | Admitting: Pharmacist

## 2023-01-09 ENCOUNTER — Other Ambulatory Visit: Payer: Self-pay | Admitting: Student

## 2023-01-09 DIAGNOSIS — Z227 Latent tuberculosis: Secondary | ICD-10-CM

## 2023-01-09 NOTE — Progress Notes (Signed)
Called and verified patient to inform her that her QuantiFERON gold TB test came back positive as expected given her prior history of latent TB.  Discussed treatment option with medication and informed patient next step will be to obtain a chest x-ray to ensure TB is latent.  Patient expressed interest in getting a repeat chest x-ray.  Ordered checks x-ray and informed patient she will need to go to [facility] imaging to get the CXR done. She verbalized understanding and plans to go on Friday, 01/11/2023.  At the end of our conversation patient reports that she has not been able to start her medication for H-pylori because she has forgotten the instructions on how to take them.  At her last visit she spent time with Dr. Valentina Lucks who explained how to take the medication.  Patient reports she has forgotten how to take them, explained to patient that I will reach out to Dr. Valentina Lucks to give her a call or she has the option of scheduling an appointment with pharmacy team and to bring the medications to the appointment if scheduled.  Patient said she would schedule an appointment to come in for explanation on how to take her medications.

## 2023-01-10 ENCOUNTER — Other Ambulatory Visit (HOSPITAL_COMMUNITY): Payer: Self-pay

## 2023-01-11 ENCOUNTER — Ambulatory Visit
Admission: RE | Admit: 2023-01-11 | Discharge: 2023-01-11 | Disposition: A | Discharge status: Home / Self Care | Payer: Medicaid Other | Source: Ambulatory Visit | Attending: Family Medicine | Admitting: Family Medicine

## 2023-01-11 ENCOUNTER — Other Ambulatory Visit (HOSPITAL_COMMUNITY): Payer: Self-pay

## 2023-01-11 DIAGNOSIS — Z227 Latent tuberculosis: Secondary | ICD-10-CM

## 2023-01-17 ENCOUNTER — Ambulatory Visit (INDEPENDENT_AMBULATORY_CARE_PROVIDER_SITE_OTHER): Payer: Medicaid Other | Admitting: Pharmacist

## 2023-01-17 ENCOUNTER — Other Ambulatory Visit (HOSPITAL_COMMUNITY): Payer: Self-pay

## 2023-01-17 ENCOUNTER — Encounter: Payer: Self-pay | Admitting: Pharmacist

## 2023-01-17 VITALS — Wt 256.4 lb

## 2023-01-17 DIAGNOSIS — A048 Other specified bacterial intestinal infections: Secondary | ICD-10-CM | POA: Diagnosis not present

## 2023-01-17 NOTE — Progress Notes (Signed)
   S:     Chief Complaint  Patient presents with   Medication Management    Medication Administration Instruction - PYLERA + PPI   32 y.o. female who presents for medication instruction.  Entire visit was conducted through an interpreter.  Ahmed #[id number].   Patient was referred and last seen by Primary Care Provider, Dr. Adah Salvage, on 12/26/2022.  At that time, I provided education on how to use medication.    At last visit, patient was prescribed PPI therapy.  She states she went to [facility] they did not have the prescription.     Today, patient arrives in fair spirits and presents without assistance.   Medication adherence N/A as patient has not taken any therapy for H.pylori.  Additionally, patient was interested in plan for TB.  She thought she was also seeing Dr. Adah Salvage in clinic today.    Current medications in her possession include: Pylera 10 day treatment course Patient denies having any PPI therapy.   Patient reported dietary habits: Eats 1 meals/day  Phone call to the outpatient pharmacy at [facility] - prescription was filled until 1/18 then returned to supply.  On 1/19 prescription for PPI was filled at another store.  Contacted [facility]  O:  Review of Systems  Respiratory:  Negative for hemoptysis and sputum production.     Physical Exam Pulmonary:     Effort: Pulmonary effort is normal.  Neurological:     Mental Status: She is alert.  Psychiatric:        Thought Content: Thought content normal.     Last 3 Office BP readings: BP Readings from Last 3 Encounters:  12/26/22 118/70  12/14/22 124/75  11/30/22 134/74    BMET    Component Value Date/Time   NA 138 10/18/2022 2005   NA 135 10/17/2020 0954   K 3.8 10/18/2022 2005   CL 101 10/18/2022 2005   CO2 22 10/18/2022 2005   GLUCOSE 113 (H) 10/18/2022 2005   BUN 8 10/18/2022 2005   BUN 7 10/17/2020 0954   CREATININE 0.66 10/18/2022 2005   CALCIUM 10.7  (H) 10/18/2022 2005   GFRNONAA >60 10/18/2022 2005   GFRAA 141 10/17/2020 0954    Patient is participating in a Managed Medicaid Plan:  Yes    A/P: H. Pylori positive with treatment plan in place.  Provided education on Pylera.  Take 3 pills 4x daily.  In combination with pantoprazole BID while taking Pylera.  Patient re-educated on purpose, proper use and potential adverse effects of treatment.  Following instruction patient verbalized understanding of treatment plan but also expressed some reservation.  She plans to pick-up pantoprazole today.  After brief discussion of benefits and risks, patient verbalized willingness to complete treatment.   Treatment for latent TB discussed briefly.  - patient reports she has not been contacted by the health department. Instructed to contact health department - number provided.    Written patient instructions provided with translation in Swahili.   Patient verbalized understanding of treatment plan.  Total time in face to face counseling 35 minutes.    Follow-up:  Pharmacist PRN. PCP clinic visit with Dr Adah Salvage or Dr. Owens Shark.

## 2023-01-17 NOTE — Patient Instructions (Addendum)
[place] tena leo.  Tafadhali kamilisha matibabu ya PYLERA.  CHUKUA tembe TATU mara NNE kwa siku Hadi vidonge vyote kwenye kisanduku vitakapoisha. Zaidi ya hayo, Tafadhali pia chukua PANTOPRAZOLE Mara mbili kwa siku. Chukua dawa hii kwenye [facility] [place] siku 10 za PYLERA endelea pantoprazole mara moja kila siku hadi ufuatiliaji.  Kwa matibabu piga simu kwa idara ya Afya ya kaunti ya [employer] [place]. Nambari yao imetolewa hapa chini. Kifua kikuu (TB): [phone]     Nice to see you again today.   Please complete the [facility] treatment.   TAKE THREE pills FOUR times per day Until all pills in the box are gone.  Additionally,  Please also take PANTOPRAZOLE Twice daily.  [facility] After you finish the 10 days of the [facility] continue the pantoprazole once daily until follow-up.   For treatment call the [facility] department. Their number is provided below. Tuberculosis (TB): [phone]

## 2023-01-17 NOTE — Assessment & Plan Note (Signed)
H. Pylori positive with treatment plan in place.  Provided education on Pylera.  Take 3 pills 4x daily.  In combination with pantoprazole BID while taking Pylera.  Patient re-educated on purpose, proper use and potential adverse effects of treatment.  Following instruction patient verbalized understanding of treatment plan but also expressed some reservation.  She plans to pick-up pantoprazole today.  After brief discussion of benefits and risks, patient verbalized willingness to complete treatment.

## 2023-01-17 NOTE — Progress Notes (Signed)
Reviewed: I agree with Dr. Graylin Shiver documentation and management.

## 2023-01-18 ENCOUNTER — Other Ambulatory Visit (HOSPITAL_COMMUNITY): Payer: Self-pay

## 2023-03-12 ENCOUNTER — Ambulatory Visit (INDEPENDENT_AMBULATORY_CARE_PROVIDER_SITE_OTHER): Payer: Medicaid Other | Admitting: Student

## 2023-03-12 ENCOUNTER — Other Ambulatory Visit (HOSPITAL_COMMUNITY)
Admission: RE | Admit: 2023-03-12 | Discharge: 2023-03-12 | Disposition: A | Discharge status: Home / Self Care | Payer: Medicaid Other | Source: Ambulatory Visit | Attending: Family Medicine | Admitting: Family Medicine

## 2023-03-12 VITALS — BP 112/78 | HR 69 | Wt 252.4 lb

## 2023-03-12 DIAGNOSIS — N898 Other specified noninflammatory disorders of vagina: Secondary | ICD-10-CM | POA: Diagnosis present

## 2023-03-12 LAB — POCT WET PREP (WET MOUNT)
Clue Cells Wet Prep Whiff POC: NEGATIVE
Trichomonas Wet Prep HPF POC: ABSENT

## 2023-03-12 MED ORDER — FLUCONAZOLE 150 MG PO TABS: ORAL_TABLET | ORAL | 0 refills | Status: DC

## 2023-03-12 NOTE — Assessment & Plan Note (Signed)
POC wet prep with yeast Treat with diflucan 150 mg x1 F/u gonorrhea, trichomonas, chlamydia swabs Declined HIV/RPR today

## 2023-03-12 NOTE — Patient Instructions (Signed)
Great seeing you today.  You have a vaginal yeast infection. We will treat this with diflucan- take one tablet today. If symptoms still present in 72 hours, take the second tablet.  I will call you with the rest of your results when they come back.  Dr. Owens Shark

## 2023-03-12 NOTE — Progress Notes (Cosign Needed Addendum)
    SUBJECTIVE:   CHIEF COMPLAINT / HPI:   Kathy Richards") is a 32 year-old here with vaginal itching and white cottage cheese-like discharge for 2 weeks.  She says she had an infection like this in [place] when she used Acupunct[facility].  She is sexually active and would like STI testing as well. Does not want a blood draw today.  Patient is not using anything currently for contraceptive.  PERTINENT  PMH / PSH: Latent lung TB, obesity  OBJECTIVE:   BP 112/78   Pulse 69   Wt 252 lb 6.4 oz (114.5 kg)   LMP 02/27/2023   SpO2 98%   BMI 38.38 kg/m   General: Well-appearing, pleasant, no distress CV: RRR Resp: Normal WOB on room air. Lungs clear. GU: Nehemiah Settle, CMA present during exam. External vulva and vagina nonerythematous, without any obvious lesions or rash. Abundant thick white and green-tinted, clumpy discharge appreciated.  Normal ruggae of vaginal walls.  Cervix is non erythematous and non-friable.    ASSESSMENT/PLAN:   Vaginal itching POC wet prep with yeast Treat with diflucan 150 mg x1 F/u gonorrhea, trichomonas, chlamydia swabs Declined HIV/RPR today   Declined contraception today.  Orvis Brill, [place]

## 2023-03-13 LAB — CERVICOVAGINAL ANCILLARY ONLY
Bacterial Vaginitis (gardnerella): POSITIVE — AB
Chlamydia: NEGATIVE
Comment: NEGATIVE
Comment: NEGATIVE
Comment: NEGATIVE
Comment: NORMAL
Neisseria Gonorrhea: NEGATIVE
Trichomonas: NEGATIVE

## 2023-03-26 ENCOUNTER — Other Ambulatory Visit: Payer: Self-pay

## 2023-03-26 ENCOUNTER — Encounter (HOSPITAL_COMMUNITY): Payer: Self-pay | Admitting: Emergency Medicine

## 2023-03-26 ENCOUNTER — Encounter (HOSPITAL_COMMUNITY): Payer: Self-pay | Admitting: *Deleted

## 2023-03-26 ENCOUNTER — Ambulatory Visit (HOSPITAL_COMMUNITY)
Admission: EM | Admit: 2023-03-26 | Discharge: 2023-03-26 | Disposition: A | Discharge status: Home / Self Care | Payer: Medicaid Other | Attending: Physician Assistant | Admitting: Physician Assistant

## 2023-03-26 ENCOUNTER — Emergency Department (HOSPITAL_COMMUNITY)
Admission: EM | Admit: 2023-03-26 | Discharge: 2023-03-26 | Disposition: A | Discharge status: Home / Self Care | Payer: Medicaid Other | Attending: Emergency Medicine | Admitting: Emergency Medicine

## 2023-03-26 DIAGNOSIS — R1010 Upper abdominal pain, unspecified: Secondary | ICD-10-CM | POA: Diagnosis present

## 2023-03-26 DIAGNOSIS — N939 Abnormal uterine and vaginal bleeding, unspecified: Secondary | ICD-10-CM | POA: Diagnosis not present

## 2023-03-26 DIAGNOSIS — R0602 Shortness of breath: Secondary | ICD-10-CM | POA: Insufficient documentation

## 2023-03-26 DIAGNOSIS — D649 Anemia, unspecified: Secondary | ICD-10-CM | POA: Insufficient documentation

## 2023-03-26 DIAGNOSIS — K2100 Gastro-esophageal reflux disease with esophagitis, without bleeding: Secondary | ICD-10-CM | POA: Insufficient documentation

## 2023-03-26 HISTORY — DX: Gastro-esophageal reflux disease without esophagitis: K21.9

## 2023-03-26 LAB — CBC WITH DIFFERENTIAL/PLATELET
Abs Immature Granulocytes: 0.01 10*3/uL (ref 0.00–0.07)
Basophils Absolute: 0.1 10*3/uL (ref 0.0–0.1)
Basophils Relative: 1 %
Eosinophils Absolute: 0.1 10*3/uL (ref 0.0–0.5)
Eosinophils Relative: 2 %
HCT: 26.4 % — ABNORMAL LOW (ref 36.0–46.0)
Hemoglobin: 7.7 g/dL — ABNORMAL LOW (ref 12.0–15.0)
Immature Granulocytes: 0 %
Lymphocytes Relative: 37 %
Lymphs Abs: 1.7 10*3/uL (ref 0.7–4.0)
MCH: 18.2 pg — ABNORMAL LOW (ref 26.0–34.0)
MCHC: 29.2 g/dL — ABNORMAL LOW (ref 30.0–36.0)
MCV: 62.3 fL — ABNORMAL LOW (ref 80.0–100.0)
Monocytes Absolute: 0.4 10*3/uL (ref 0.1–1.0)
Monocytes Relative: 10 %
Neutro Abs: 2.3 10*3/uL (ref 1.7–7.7)
Neutrophils Relative %: 50 %
Platelets: 321 10*3/uL (ref 150–400)
RBC: 4.24 MIL/uL (ref 3.87–5.11)
RDW: 17.2 % — ABNORMAL HIGH (ref 11.5–15.5)
WBC: 4.5 10*3/uL (ref 4.0–10.5)
nRBC: 0 % (ref 0.0–0.2)

## 2023-03-26 LAB — COMPREHENSIVE METABOLIC PANEL
ALT: 19 U/L (ref 0–44)
ALT: 18 U/L (ref 0–44)
AST: 20 U/L (ref 15–41)
AST: 20 U/L (ref 15–41)
Albumin: 3.5 g/dL (ref 3.5–5.0)
Albumin: 3.7 g/dL (ref 3.5–5.0)
Alkaline Phosphatase: 88 U/L (ref 38–126)
Alkaline Phosphatase: 97 U/L (ref 38–126)
Anion gap: 9 (ref 5–15)
Anion gap: 8 (ref 5–15)
BUN: 5 mg/dL — ABNORMAL LOW (ref 6–20)
BUN: 5 mg/dL — ABNORMAL LOW (ref 6–20)
CO2: 24 mmol/L (ref 22–32)
CO2: 24 mmol/L (ref 22–32)
Calcium: 9.4 mg/dL (ref 8.9–10.3)
Calcium: 9.2 mg/dL (ref 8.9–10.3)
Chloride: 102 mmol/L (ref 98–111)
Chloride: 103 mmol/L (ref 98–111)
Creatinine, Ser: 0.72 mg/dL (ref 0.44–1.00)
Creatinine, Ser: 0.65 mg/dL (ref 0.44–1.00)
GFR, Estimated: >60 mL/min (ref >60)
GFR, Estimated: >60 mL/min (ref >60)
Glucose, Bld: 104 mg/dL — ABNORMAL HIGH (ref 70–99)
Glucose, Bld: 95 mg/dL (ref 70–99)
Potassium: 3.9 mmol/L (ref 3.5–5.1)
Potassium: 3.6 mmol/L (ref 3.5–5.1)
Sodium: 135 mmol/L (ref 135–145)
Sodium: 135 mmol/L (ref 135–145)
Total Bilirubin: 0.5 mg/dL (ref 0.3–1.2)
Total Bilirubin: 0.3 mg/dL (ref 0.3–1.2)
Total Protein: 7.9 g/dL (ref 6.5–8.1)
Total Protein: 8.2 g/dL — ABNORMAL HIGH (ref 6.5–8.1)

## 2023-03-26 LAB — CBC
HCT: 27 % — ABNORMAL LOW (ref 36.0–46.0)
Hemoglobin: 7.9 g/dL — ABNORMAL LOW (ref 12.0–15.0)
MCH: 18.6 pg — ABNORMAL LOW (ref 26.0–34.0)
MCHC: 29.3 g/dL — ABNORMAL LOW (ref 30.0–36.0)
MCV: 63.7 fL — ABNORMAL LOW (ref 80.0–100.0)
Platelets: 334 10*3/uL (ref 150–400)
RBC: 4.24 MIL/uL (ref 3.87–5.11)
RDW: 17.2 % — ABNORMAL HIGH (ref 11.5–15.5)
WBC: 4.5 10*3/uL (ref 4.0–10.5)
nRBC: 0 % (ref 0.0–0.2)

## 2023-03-26 LAB — I-STAT BETA HCG BLOOD, ED (MC, WL, AP ONLY): I-stat hCG, quantitative: <5 m[IU]/mL (ref <5)

## 2023-03-26 LAB — TYPE AND SCREEN
ABO/RH(D): O POS
Antibody Screen: NEGATIVE

## 2023-03-26 LAB — LIPASE, BLOOD: Lipase: 29 U/L (ref 11–51)

## 2023-03-26 LAB — ABO/RH: ABO/RH(D): O POS

## 2023-03-26 MED ORDER — ALUM & MAG HYDROXIDE-SIMETH 200-200-20 MG/5ML PO SUSP
ORAL | Status: AC
  Filled 2023-03-26: qty 30

## 2023-03-26 MED ORDER — ALUM & MAG HYDROXIDE-SIMETH 200-200-20 MG/5ML PO SUSP
30.0000 mL | Freq: Once | ORAL | Status: AC
  Administered 2023-03-26: 30 mL via ORAL

## 2023-03-26 NOTE — ED Notes (Signed)
Patient is being discharged from the Urgent Care and sent to the Emergency Department via POV . Per E. Raspet,, patient is in need of higher level of care due to systematic anemia. Patient is aware and verbalizes understanding of plan of care.  Vitals:   03/26/23 1203  BP: 117/75  Pulse: 89  Resp: 16  Temp: 98.8 F (37.1 C)  SpO2: 96%

## 2023-03-26 NOTE — ED Provider Notes (Signed)
[facility]    CSN: [id number] Arrival date & time: 03/26/23  1109      History   Chief Complaint Chief Complaint  Patient presents with   Abdominal Pain    HPI Kathy Richards is a 32 y.o. female.   Patient reports that for several months she has had intermittent epigastric abdominal pain and chest pain.  She was seen by our clinic October 2023 at which point she was started on PPI which provided some relief of symptoms.  She has been diagnosed with H. pylori in the past and reports completing course but has not had follow-up.  She has not seen a GI specialist in the past.  She reports that symptoms began severe over the weekend that she was seen in the emergency room 3 different health system.  At that time chest x-ray was negative but she was noted to be anemic with a hemoglobin of 7.5 which is decreased from her baseline of approximately 11 according to chart review.  She was encouraged to take oral iron and follow-up with her primary care.  Chest x-ray was normal.  Additional blood work including initial troponin were negative.  She has not been taking any over-the-counter medication for symptom management.  Denies history of pancreatitis and does not use GLP-1 agonist.  She denies any recent medication changes, increased NSAID use, alcohol consumption.  Denies any recent dietary changes.  She does report some shortness of breath and fatigue is unsure if this could be related to her current symptoms.  Denies any melena or hematochezia.  Reports nausea but no vomiting.    Past Medical History:  Diagnosis Date   GERD (gastroesophageal reflux disease)    Microcytic anemia 11/21/2020   Obesity     Patient Active Problem List   Diagnosis Date Noted   Vaginal itching 03/12/2023   H. pylori infection 12/14/2022   Adnexal cyst 04/27/2022   TB lung, latent 05/31/2021   Language barrier 05/11/2021   Obesity (BMI 30-39.9) 11/21/2020   Refugee health examination 09/20/2020     Past Surgical History:  Procedure Laterality Date   CESAREAN SECTION     x2    OB History     Gravida  3   Para  3   Term  3   Preterm  0   AB  0   Living  1      SAB  0   IAB  0   Ectopic  0   Multiple  0   Live Births  3        Obstetric Comments  Two children died shortly after birth-- both Cesarean deliveries           Home Medications    Prior to Admission medications   Not on File    Family History Family History  Problem Relation Age of Onset   HIV Mother    Hypertension Father     Social History Social History   Tobacco Use   Smoking status: Never   Smokeless tobacco: Never  Vaping Use   Vaping Use: Never used  Substance Use Topics   Alcohol use: Not Currently   Drug use: Never     Allergies   Patient has no known allergies.   Review of Systems Review of Systems  Constitutional:  Positive for activity change. Negative for appetite change, fatigue and fever.  Respiratory:  Positive for shortness of breath (intermittent). Negative for cough.   Cardiovascular:  Negative for  chest pain.  Gastrointestinal:  Positive for abdominal pain and nausea. Negative for constipation, diarrhea and vomiting.  Neurological:  Negative for dizziness, light-headedness and headaches.     Physical Exam Triage Vital Signs ED Triage Vitals  Enc Vitals Group     BP 03/26/23 1203 117/75     Pulse Rate 03/26/23 1203 89     Resp 03/26/23 1203 16     Temp 03/26/23 1203 98.8 F (37.1 C)     Temp Source 03/26/23 1203 Oral     SpO2 03/26/23 1203 96 %     Weight --      Height --      Head Circumference --      Peak Flow --      Pain Score 03/26/23 1201 8     Pain Loc --      Pain Edu? --      Excl. in [place]? --    No data found.  Updated Vital Signs BP 117/75 (BP Location: Left Arm)   Pulse 89   Temp 98.8 F (37.1 C) (Oral)   Resp 16   LMP 03/22/2023 (Exact Date)   SpO2 96%   Visual Acuity Right Eye Distance:   Left Eye  Distance:   Bilateral Distance:    Right Eye Near:   Left Eye Near:    Bilateral Near:     Physical Exam Vitals reviewed.  Constitutional:      General: She is awake. She is not in acute distress.    Appearance: Normal appearance. She is well-developed. She is not ill-appearing.     Comments: Very pleasant female appears stated age in no acute distress sitting comfortably in exam room  HENT:     Head: Normocephalic and atraumatic.  Cardiovascular:     Rate and Rhythm: Normal rate and regular rhythm.     Heart sounds: Normal heart sounds, S1 normal and S2 normal. No murmur heard. Pulmonary:     Effort: Pulmonary effort is normal.     Breath sounds: Normal breath sounds. No wheezing, rhonchi or rales.  Abdominal:     General: Bowel sounds are normal.     Palpations: Abdomen is soft.     Tenderness: There is abdominal tenderness in the epigastric area. There is no right CVA tenderness, left CVA tenderness, guarding or rebound. Negative signs include Murphy's sign, Rovsing's sign, McBurney's sign and psoas sign.  Psychiatric:        Behavior: Behavior is cooperative.      UC Treatments / Results  Labs (all labs ordered are listed, but only abnormal results are displayed) Labs Reviewed  CBC WITH DIFFERENTIAL/PLATELET - Abnormal; Notable for the following components:      Result Value   Hemoglobin 7.7 (*)    HCT 26.4 (*)    MCV 62.3 (*)    MCH 18.2 (*)    MCHC 29.2 (*)    RDW 17.2 (*)    All other components within normal limits  COMPREHENSIVE METABOLIC PANEL - Abnormal; Notable for the following components:   Glucose, Bld 104 (*)    BUN 5 (*)    All other components within normal limits  LIPASE, BLOOD    EKG   Radiology No results found.  Procedures Procedures (including critical care time)  Medications Ordered in UC Medications  alum & mag hydroxide-simeth (MAALOX/MYLANTA) 200-200-20 MG/5ML suspension 30 mL (30 mLs Oral Given 03/26/23 1236)    Initial  Impression / Assessment and Plan / UC Course  I have reviewed the triage vital signs and the nursing notes.  Pertinent labs & imaging results that were available during my care of the patient were reviewed by me and considered in my medical decision making (see chart for details).     Patient is well-appearing, afebrile, nontoxic, nontachycardic.  Vital signs and physical exam are reassuring.  Review of chart indicated that patient had significant drop in her hemoglobin from 11 5 months ago to 7.2 at her hospitalization with [facility] 03/23/2023.  She did report that she is experiencing some shortness of breath and general fatigue particularly with exertion such as when she is going up the stairs at her home.  We rechecked her labs including her CBC which showed persistently low hemoglobin at 7.7.  Discussed that given she is symptomatic it is worthwhile to go to the emergency room particularly as I am concerned about bleeding ulcer given her clinical presentation and she may benefit from additional testing and evaluation.  Her additional testing including lipase and CMP were essentially normal.  Discussed case with Dr. Windy Carina who agreed that she should be evaluated emergently in the emergency room as she may benefit from additional interventions that we do not have the capabilities of an urgent care.  Patient is agreeable and will go directly to [facility], ER for further evaluation and management.  She was stable at time of discharge.  Final Clinical Impressions(s) / UC Diagnoses   Final diagnoses:  Symptomatic anemia  Shortness of breath  Upper abdominal pain  Gastroesophageal reflux disease with esophagitis, unspecified whether hemorrhage   Discharge Instructions   None    ED Prescriptions   None    PDMP not reviewed this encounter.   Terrilee Croak, PA-C 03/26/23 1428

## 2023-03-26 NOTE — ED Triage Notes (Addendum)
Pt states she was sent in from urgent care for hemoglobin of 7. Endorses shortness or breath, dizziness and fatigue for approx 2 weeks.

## 2023-03-26 NOTE — ED Provider Notes (Signed)
[facility] Provider Note   CSN: [id number] Arrival date & time: 03/26/23  1457     History  Chief Complaint  Patient presents with   Abnormal Lab    Kathy Richards is a 32 y.o. female.  Patient presents to the emergency room complaining of low hemoglobin levels.  Patient was reportedly evaluated at an emergency department over the weekend for left-sided chest pain.  Workup at that time was significant for hemoglobin of 7.5.  Patient was planning to follow-up with primary care.  Patient was evaluated today by urgent care who noted her hemoglobin to be 7.7.  They were concerned about possible bleeding and recommended she come to the emergency department for evaluation.  The patient states that over the past few days, ending Friday, she had heavy menstrual bleeding.  She states she has irregular periods that only come sporadically over every few months and when they do come they are very heavy, frequently with clots.  She states her most recent cycle was heavy with clots.  She does not have outpatient OB/GYN care at this time.  She does endorse increased shortness of breath, mostly with exertion.  She denies current vaginal bleeding, nausea, vomiting.  She states that if she climbs stairs she feels tired.  Past medical history significant for GERD, microcytic anemia, H. pylori infection  HPI     Home Medications Prior to Admission medications   Not on File      Allergies    Patient has no known allergies.    Review of Systems   Review of Systems  Physical Exam Updated Vital Signs BP 110/78 (BP Location: Right Arm)   Pulse 89   Temp 98.4 F (36.9 C) (Oral)   Resp 16   Ht 5\' 8"  (1.727 m)   Wt 113.4 kg   LMP 03/22/2023 (Exact Date)   SpO2 100%   BMI 38.01 kg/m  Physical Exam Vitals and nursing note reviewed.  Constitutional:      General: She is not in acute distress.    Appearance: She is well-developed.  HENT:     Head:  Normocephalic and atraumatic.  Eyes:     Conjunctiva/sclera: Conjunctivae normal.  Cardiovascular:     Rate and Rhythm: Normal rate and regular rhythm.     Heart sounds: No murmur heard. Pulmonary:     Effort: Pulmonary effort is normal. No respiratory distress.     Breath sounds: Normal breath sounds.  Abdominal:     Palpations: Abdomen is soft.     Tenderness: There is no abdominal tenderness.  Musculoskeletal:        General: No swelling.     Cervical back: Neck supple.  Skin:    General: Skin is warm and dry.     Capillary Refill: Capillary refill takes less than 2 seconds.  Neurological:     Mental Status: She is alert.  Psychiatric:        Mood and Affect: Mood normal.     ED Results / Procedures / Treatments   Labs (all labs ordered are listed, but only abnormal results are displayed) Labs Reviewed  COMPREHENSIVE METABOLIC PANEL - Abnormal; Notable for the following components:      Result Value   BUN 5 (*)    Total Protein 8.2 (*)    All other components within normal limits  CBC - Abnormal; Notable for the following components:   Hemoglobin 7.9 (*)    HCT 27.0 (*)  MCV 63.7 (*)    MCH 18.6 (*)    MCHC 29.3 (*)    RDW 17.2 (*)    All other components within normal limits  I-STAT BETA HCG BLOOD, ED ([facility], [facility], [facility] ONLY)  TYPE AND SCREEN  ABO/RH    EKG None  Radiology No results found.  Procedures Procedures    Medications Ordered in ED Medications - No data to display  ED Course/ Medical Decision Making/ A&P                             Medical Decision Making Amount and/or Complexity of Data Reviewed Labs: ordered.   This patient presents to the ED for concern of anemia, this involves an extensive number of treatment options, and is a complaint that carries with it a high risk of complications and morbidity.  The differential diagnosis includes acute blood loss anemia, iron deficiency anemia, anemia of chronic disease, others   Co  morbidities that complicate the patient evaluation  Microcytic anemia, H. pylori infection   Additional history obtained:   External records from outside source obtained and reviewed including urgent care notes from today, outside ER notes from Friday   Lab Tests:  I Ordered, and personally interpreted labs.  The pertinent results include: Hgb 7.9 with a microcytic pattern, negative pregnancy test, grossly unremarkable CMP   Cardiac Monitoring: / EKG:  The patient was maintained on a cardiac monitor.  I personally viewed and interpreted the cardiac monitored which showed an underlying rhythm of: Normal sinus rhythm   Consultations Obtained:  I requested consultation with the on-call gynecologist, Dr.Constant,  and discussed lab and imaging findings as well as pertinent plan - they recommend: follow up in their office    Social Determinants of Health:  Patient has Medicaid for her primary insurance coverage   Test / Admission - Considered:  Patient's hemoglobin was low, 7.9, but does not seem to be low enough/symptomatic enough to require transfusion at this time.  The patient does state that she has had iron infusions in the past.  Plan to have patient follow-up with her primary care to discuss her overall anemia and for further recommendations as far as iron infusions.  The patient endorses having a heavy menstrual cycle which was irregular.  She states the bleeding stopped on either Friday or Saturday.  I believe this is likely the source of her anemia.  Discussed case with gynecology. They recommend outpatient follow up in their office for evaluation of their abnormal uterine bleeding.  Discharge home with return precautions        Final Clinical Impression(s) / ED Diagnoses Final diagnoses:  Symptomatic anemia  Abnormal uterine bleeding    Rx / DC Orders ED Discharge Orders     None         Ronny Bacon 03/26/23 1830    Davonna Belling,  MD 03/26/23 2236

## 2023-03-26 NOTE — Discharge Instructions (Addendum)
You were evaluated today due to low hemoglobin.  This is likely due to your heavy menstrual cycle which finished over the weekend.  Please follow-up with the gynecology practice listed above.  If you do not hear from them in the next few days call them to schedule an appointment.  If you develop any life-threatening symptoms please return to the emergency department

## 2023-03-26 NOTE — ED Triage Notes (Signed)
Pt states she is having some pain on her right side of her chest and her back. She states this has been on and off for over a year. She went to the ER was dx with acid reflux and she use to take meds for this but ran out. She also states she had hpylori and she hasn't been reschecked for that after her meds were completed.

## 2023-04-15 ENCOUNTER — Ambulatory Visit: Payer: Medicaid Other | Admitting: Obstetrics and Gynecology

## 2023-05-02 NOTE — Progress Notes (Signed)
    SUBJECTIVE:   CHIEF COMPLAINT: follow up  HPI:   Kathy Richards is a 32 y.o.  with history notable for adjustment disorder and heavy menses presenting for follow up from ED.  A Swahili interpreter was used---Lillian.   Was seen in ED for anemia and menorrhagia. Hemglobin in the 7s. She was not started on iron. It was recommended she follow up with Gynecology. She reports heavy menses and fatigue. LMP 4/28. Also started bleeding again today. She is not interested in any contraception or medications. She reports her bleeding today is light. Denies CP, dyspnea, syncope. Does have fatigue. She has not been taking iron, none prescribed by ED. Last sexually active yesterday. Has one partner currently. She has had trouble making appointments due to stressors at home (has daughter, single mom) and language barrier.   PERTINENT  PMH / PSH/Family/Social History : obesity, AUB  OBJECTIVE:   BP 120/78   Pulse 74   Ht 5\' 8"  (1.727 m)   Wt 247 lb 9.6 oz (112.3 kg)   SpO2 99%   BMI 37.65 kg/m   Today's weight:  Last Weight  Most recent update: 05/03/2023 11:03 AM    Weight  112.3 kg (247 lb 9.6 oz)            Review of prior weights: Filed Weights   05/03/23 1102  Weight: 247 lb 9.6 oz (112.3 kg)    Cardiac: Warm well perfused.  Capillary refill less than 3 seconds Respiratory breathing comfortably on room air Psych: Pleasant normal affect, appropriate, normal rate of speech    ASSESSMENT/PLAN:   H. pylori infection Needs TOC at follow up.  Follow up in July for this   Abnormal uterine bleeding Unclear cause. Has had prior imaging and has seen Gynecology in past.  Desires pregnancy, declines provera today  I have significant concerns about her degree of anemia, may need IV iron Start PO iron, discussed how to get OTC  Will ask Congregational RN to see if she can assist with some barriers to care       Terisa Starr, MD  Family Medicine Teaching Service  [facility]  [facility]

## 2023-05-03 ENCOUNTER — Ambulatory Visit: Payer: Medicaid Other | Admitting: Family Medicine

## 2023-05-03 ENCOUNTER — Other Ambulatory Visit: Payer: Self-pay

## 2023-05-03 ENCOUNTER — Encounter: Payer: Self-pay | Admitting: Family Medicine

## 2023-05-03 VITALS — BP 120/78 | HR 74 | Ht 68.0 in | Wt 247.6 lb

## 2023-05-03 DIAGNOSIS — N644 Mastodynia: Secondary | ICD-10-CM

## 2023-05-03 DIAGNOSIS — N898 Other specified noninflammatory disorders of vagina: Secondary | ICD-10-CM

## 2023-05-03 DIAGNOSIS — D509 Iron deficiency anemia, unspecified: Secondary | ICD-10-CM | POA: Diagnosis not present

## 2023-05-03 DIAGNOSIS — N939 Abnormal uterine and vaginal bleeding, unspecified: Secondary | ICD-10-CM | POA: Diagnosis not present

## 2023-05-03 DIAGNOSIS — A048 Other specified bacterial intestinal infections: Secondary | ICD-10-CM | POA: Diagnosis not present

## 2023-05-03 LAB — POCT URINE PREGNANCY: Preg Test, Ur: NEGATIVE

## 2023-05-03 MED ORDER — FERROUS SULFATE 324 MG PO TBEC
324.0000 mg | DELAYED_RELEASE_TABLET | Freq: Every day | ORAL | 3 refills | Status: DC
Start: 2023-05-03 — End: 2023-05-06

## 2023-05-03 MED ORDER — MEDROXYPROGESTERONE ACETATE 10 MG PO TABS: 10.0000 mg | ORAL_TABLET | Freq: Every day | ORAL | 0 refills | Status: DC

## 2023-05-03 NOTE — Assessment & Plan Note (Signed)
Needs TOC at follow up.  Follow up in July for this

## 2023-05-03 NOTE — Patient Instructions (Addendum)
It was wonderful to see you today.  Please bring ALL of your medications with you to every visit.   Today we talked about:  - Getting blood work  - You will be called about a Gynecology visit   - [place] Theresia Majors ya damu  - [place] kuhusu ziara ya Gynecology  Please follow up in 2 months   Thank you for choosing [facility].   Please call [phone] with any questions about today's appointment.  Please be sure to schedule follow up at the front  desk before you leave today.   Terisa Starr, MD  Family Medicine

## 2023-05-03 NOTE — Assessment & Plan Note (Signed)
Unclear cause. Has had prior imaging and has seen Gynecology in past.  Desires pregnancy, declines provera today  I have significant concerns about her degree of anemia, may need IV iron Start PO iron, discussed how to get OTC  Will ask Congregational RN to see if she can assist with some barriers to care

## 2023-05-04 ENCOUNTER — Telehealth: Payer: Self-pay | Admitting: Family Medicine

## 2023-05-04 LAB — CBC
Hematocrit: 25 % — ABNORMAL LOW (ref 34.0–46.6)
Hemoglobin: 6.7 g/dL — CL (ref 11.1–15.9)
MCH: 16.8 pg — ABNORMAL LOW (ref 26.6–33.0)
MCHC: 26.8 g/dL — ABNORMAL LOW (ref 31.5–35.7)
MCV: 63 fL — ABNORMAL LOW (ref 79–97)
Platelets: 228 10*3/uL (ref 150–450)
RBC: 3.98 x10E6/uL (ref 3.77–5.28)
RDW: 17 % — ABNORMAL HIGH (ref 11.7–15.4)
WBC: 4.6 10*3/uL (ref 3.4–10.8)

## 2023-05-04 LAB — IRON,TIBC AND FERRITIN PANEL
Ferritin: 7 ng/mL — ABNORMAL LOW (ref 15–150)
Iron Saturation: 3 % — CL (ref 15–55)
Iron: 11 ug/dL — ABNORMAL LOW (ref 27–159)
Total Iron Binding Capacity: 416 ug/dL (ref 250–450)
UIBC: 405 ug/dL (ref 131–425)

## 2023-05-04 NOTE — Telephone Encounter (Signed)
The patient speaks Swahili as their primary language.  An interpreter was used for the entire visit.  Called patient she reports 'heavy bleeding like I just had a baby'. Given Hgb <7 and ongoing bleeding, recommend ED evaluation for IV estrogen and IV iron (or transfusion). She will proceed to ED.   Terisa Starr, MD  Family Medicine Teaching Service

## 2023-05-05 ENCOUNTER — Telehealth: Payer: Self-pay

## 2023-05-05 NOTE — Congregational Nurse Program (Signed)
  Dept: [phone]   Congregational Nurse Program Note  Date of Encounter: 05/05/2023  Past Medical History: Past Medical History:  Diagnosis Date   GERD (gastroesophageal reflux disease)    Microcytic anemia 11/21/2020   Obesity     Encounter Details:  CNP Questionnaire - 05/05/23 1402       Questionnaire   Ask client: Do you give verbal consent for me to treat you today? Yes    Student Assistance N/A    Location Patient Served  [facility]    Visit Setting with Client Organization    Patient Status Refugee    Insurance Medicaid    Insurance/Financial Assistance Referral N/A    Medication Have Medication Insecurities    Medical Provider Yes    Screening Referrals Made N/A    Medical Referrals Made ED    Medical Appointment Made N/A    Recently w/o PCP, now 1st time PCP visit completed due to CNs referral or appointment made N/A    Food N/A    Transportation N/A    Housing/Utilities N/A    Interpersonal Safety N/A    Interventions Advocate/Support;Navigate Healthcare System;Case Management;Counsel;Educate;Reviewed Medications    Abnormal to Normal Screening Since Last CN Visit N/A    Screenings CN Performed N/A    Sent Client to Lab for: N/A    Did client attend any of the following based off CNs referral or appointments made? N/A    ED Visit Averted N/A    Life-Saving Intervention Made Yes            Patient c/o extreme fatigue. She continues too have vaginal bleeding and has sent me pictures of large clots in her bathroom commode. Considering that her HGB was 6.7 on 5/10, I have advised her to go to ED. I will inform her PCP as well.  Nicole Cella Gae Bihl RN BSN PCCN  [facility] [phone]-cell [phone]-office

## 2023-05-05 NOTE — Telephone Encounter (Signed)
I have contacted patient to assist with gynecologist appointments.Telephone call not answer, voice message left   [name] RN BSN PCCN  [facility] [phone]-cell [phone]-office

## 2023-05-06 ENCOUNTER — Encounter (HOSPITAL_COMMUNITY): Payer: Self-pay

## 2023-05-06 ENCOUNTER — Emergency Department (HOSPITAL_COMMUNITY)
Admission: EM | Admit: 2023-05-06 | Discharge: 2023-05-06 | Disposition: A | Discharge status: Home / Self Care | Payer: Medicaid Other | Attending: Emergency Medicine | Admitting: Emergency Medicine

## 2023-05-06 ENCOUNTER — Other Ambulatory Visit: Payer: Self-pay

## 2023-05-06 ENCOUNTER — Telehealth: Payer: Self-pay

## 2023-05-06 DIAGNOSIS — D509 Iron deficiency anemia, unspecified: Secondary | ICD-10-CM

## 2023-05-06 DIAGNOSIS — D649 Anemia, unspecified: Secondary | ICD-10-CM

## 2023-05-06 LAB — CBC WITH DIFFERENTIAL/PLATELET
Abs Immature Granulocytes: 0 10*3/uL (ref 0.00–0.07)
Basophils Absolute: 0 10*3/uL (ref 0.0–0.1)
Basophils Relative: 0 %
Eosinophils Absolute: 0.1 10*3/uL (ref 0.0–0.5)
Eosinophils Relative: 2 %
HCT: 25.1 % — ABNORMAL LOW (ref 36.0–46.0)
Hemoglobin: 6.7 g/dL — CL (ref 12.0–15.0)
Lymphocytes Relative: 36 %
Lymphs Abs: 1.4 10*3/uL (ref 0.7–4.0)
MCH: 16.8 pg — ABNORMAL LOW (ref 26.0–34.0)
MCHC: 26.7 g/dL — ABNORMAL LOW (ref 30.0–36.0)
MCV: 62.8 fL — ABNORMAL LOW (ref 80.0–100.0)
Monocytes Absolute: 0.1 10*3/uL (ref 0.1–1.0)
Monocytes Relative: 3 %
Neutro Abs: 2.2 10*3/uL (ref 1.7–7.7)
Neutrophils Relative %: 59 %
Platelets: 235 10*3/uL (ref 150–400)
RBC: 4 MIL/uL (ref 3.87–5.11)
RDW: 17.9 % — ABNORMAL HIGH (ref 11.5–15.5)
WBC: 3.8 10*3/uL — ABNORMAL LOW (ref 4.0–10.5)
nRBC: 0 % (ref 0.0–0.2)

## 2023-05-06 LAB — COMPREHENSIVE METABOLIC PANEL
ALT: 13 U/L (ref 0–44)
AST: 16 U/L (ref 15–41)
Albumin: 3.5 g/dL (ref 3.5–5.0)
Alkaline Phosphatase: 106 U/L (ref 38–126)
Anion gap: 8 (ref 5–15)
BUN: 8 mg/dL (ref 6–20)
CO2: 22 mmol/L (ref 22–32)
Calcium: 8.8 mg/dL — ABNORMAL LOW (ref 8.9–10.3)
Chloride: 105 mmol/L (ref 98–111)
Creatinine, Ser: 0.83 mg/dL (ref 0.44–1.00)
GFR, Estimated: >60 mL/min (ref >60)
Glucose, Bld: 106 mg/dL — ABNORMAL HIGH (ref 70–99)
Potassium: 4 mmol/L (ref 3.5–5.1)
Sodium: 135 mmol/L (ref 135–145)
Total Bilirubin: 0.2 mg/dL — ABNORMAL LOW (ref 0.3–1.2)
Total Protein: 7 g/dL (ref 6.5–8.1)

## 2023-05-06 LAB — I-STAT BETA HCG BLOOD, ED (MC, WL, AP ONLY): I-stat hCG, quantitative: <5 m[IU]/mL (ref <5)

## 2023-05-06 LAB — PREPARE RBC (CROSSMATCH)

## 2023-05-06 MED ORDER — SODIUM CHLORIDE 0.9% IV SOLUTION: Freq: Once | INTRAVENOUS | Status: AC

## 2023-05-06 MED ORDER — FERROUS SULFATE 324 MG PO TBEC
324.0000 mg | DELAYED_RELEASE_TABLET | ORAL | 3 refills | Status: DC
Start: 2023-05-06 — End: 2023-09-05

## 2023-05-06 NOTE — Telephone Encounter (Signed)
Follow up appointment scheduled with gynecologist for vaginal bleeding.Marland Kitchen Appointment scheduled for 06/13/23. Patient made aware via text message.  Nicole Cella Fuad Forget RN BSN PCCN  [facility] [phone]-cell [phone]-office

## 2023-05-06 NOTE — ED Notes (Signed)
Electronic Blood consent obtained from pt by this RN.

## 2023-05-06 NOTE — ED Provider Notes (Signed)
[place] [facility] Provider Note   CSN: [id number] Arrival date & time: 05/06/23  1320     History  Chief Complaint  Patient presents with   Anemia    Kathy Richards is a 32 y.o. female.   Anemia Associated symptoms include headaches (Intermittent) and shortness of breath (Exertional).  Patient presents for anemia.  Medical history includes latent TB, anemia, menorrhagia, GERD.  Patient endorses recent fatigue, exertional shortness of breath, intermittent headaches.  She was seen by PCP 3 days ago.  She was advised to come to the ED for blood transfusion.  Patient denies any blood loss other than her heavy periods.  She is currently trying to get pregnant and does not wish to get started on hormone therapy.     Home Medications Prior to Admission medications   Medication Sig Start Date End Date Taking? Authorizing Provider  ferrous sulfate 324 MG TBEC Take 1 tablet (324 mg total) by mouth every other day. 05/06/23   Gloris Manchester, MD      Allergies    Patient has no known allergies.    Review of Systems   Review of Systems  Constitutional:  Positive for fatigue.  Respiratory:  Positive for shortness of breath (Exertional).   Neurological:  Positive for headaches (Intermittent).  All other systems reviewed and are negative.   Physical Exam Updated Vital Signs BP 124/71   Pulse 63   Temp 97.9 F (36.6 C) (Oral)   Resp 19   Ht 5\' 8"  (1.727 m)   Wt 112 kg   SpO2 100%   BMI 37.56 kg/m  Physical Exam Vitals and nursing note reviewed.  Constitutional:      General: She is not in acute distress.    Appearance: Normal appearance. She is well-developed. She is not ill-appearing, toxic-appearing or diaphoretic.  HENT:     Head: Normocephalic and atraumatic.     Right Ear: External ear normal.     Left Ear: External ear normal.     Nose: Nose normal.     Mouth/Throat:     Mouth: Mucous membranes are moist.  Eyes:      Extraocular Movements: Extraocular movements intact.     Conjunctiva/sclera: Conjunctivae normal.  Cardiovascular:     Rate and Rhythm: Normal rate and regular rhythm.     Heart sounds: No murmur heard. Pulmonary:     Effort: Pulmonary effort is normal. No respiratory distress.     Breath sounds: Normal breath sounds. No wheezing or rales.  Chest:     Chest wall: No tenderness.  Abdominal:     General: There is no distension.     Palpations: Abdomen is soft.     Tenderness: There is no abdominal tenderness.  Musculoskeletal:        General: No swelling. Normal range of motion.     Cervical back: Normal range of motion and neck supple.     Right lower leg: No edema.     Left lower leg: No edema.  Skin:    General: Skin is warm and dry.     Capillary Refill: Capillary refill takes less than 2 seconds.     Coloration: Skin is not jaundiced.  Neurological:     General: No focal deficit present.     Mental Status: She is alert and oriented to person, place, and time.     Cranial Nerves: No cranial nerve deficit.     Sensory: No sensory deficit.  Motor: No weakness.     Coordination: Coordination normal.  Psychiatric:        Mood and Affect: Mood normal.        Behavior: Behavior normal.        Thought Content: Thought content normal.        Judgment: Judgment normal.     ED Results / Procedures / Treatments   Labs (all labs ordered are listed, but only abnormal results are displayed) Labs Reviewed  CBC WITH DIFFERENTIAL/PLATELET - Abnormal; Notable for the following components:      Result Value   WBC 3.8 (*)    Hemoglobin 6.7 (*)    HCT 25.1 (*)    MCV 62.8 (*)    MCH 16.8 (*)    MCHC 26.7 (*)    RDW 17.9 (*)    All other components within normal limits  COMPREHENSIVE METABOLIC PANEL - Abnormal; Notable for the following components:   Glucose, Bld 106 (*)    Calcium 8.8 (*)    Total Bilirubin 0.2 (*)    All other components within normal limits  I-STAT BETA HCG  BLOOD, ED ([facility], [facility], [facility] ONLY)  TYPE AND SCREEN  PREPARE RBC (CROSSMATCH)    EKG EKG Interpretation  Date/Time:  Monday May 06 2023 15:25:20 EDT Ventricular Rate:  63 PR Interval:  228 QRS Duration: 94 QT Interval:  370 QTC Calculation: 379 R Axis:   77 Text Interpretation: Sinus rhythm Prolonged PR interval Confirmed by Gloris Manchester [phone]) on 05/06/2023 4:17:09 PM  Radiology No results found.  Procedures Procedures    Medications Ordered in ED Medications  0.9 %  sodium chloride infusion (Manually program via Guardrails IV Fluids) ( Intravenous New Bag/Given 05/06/23 1645)    ED Course/ Medical Decision Making/ A&P                             Medical Decision Making Amount and/or Complexity of Data Reviewed Labs: ordered.  Risk Prescription drug management.   This patient presents to the ED for concern of fatigue and generalized weakness, this involves an extensive number of treatment options, and is a complaint that carries with it a high risk of complications and morbidity.  The differential diagnosis includes symptomatic anemia, dehydration, metabolic derangements   Co morbidities that complicate the patient evaluation  Metromenorrhagia, GERD, anemia   Additional history obtained:  Additional history obtained from N/A External records from outside source obtained and reviewed including EMR   Lab Tests:  I Ordered, and personally interpreted labs.  The pertinent results include: Acute on chronic anemia with hemoglobin of 6.7; slight leukopenia; normal kidney function and normal electrolytes  Cardiac Monitoring: / EKG:  The patient was maintained on a cardiac monitor.  I personally viewed and interpreted the cardiac monitored which showed an underlying rhythm of: Sinus rhythm   Consultations Obtained:  I requested consultation with the gynecologist, Dr. Debroah Loop,  and discussed lab and imaging findings as well as pertinent plan - they recommend: Resumption  of iron supplementation and outpatient follow-up   Problem List / ED Course / Critical interventions / Medication management  Patient presents for ongoing symptomatic anemia secondary to menorrhagia.  She was seen in the ED 1 month ago for the same.  Hemoglobin at that time was in the sevens and she did not get a blood transfusion.  She was seen by PCP on Friday.  Blood work from that time was notable  for a hemoglobin of 6.7.  It remains at that level today.  Patient has been symptomatic with exertional dyspnea, fatigue.  Patient denies any history of receiving blood transfusions.  When consenting her for blood transfusion, patient was hesitant but ultimately agreeable.  1 unit PRBCs was ordered.  I talked with Dr. Debroah Loop with OB/GYN who recommends iron supplementation and no other medications given that she is trying to get pregnant.  Patient currently has GYN follow-up scheduled for June 20.  Patient underwent blood transfusion without any adverse effects.  She was given a prescription for iron tablets.  She was discharged in good condition. I ordered medication including PRBCs for symptomatic anemia Reevaluation of the patient after these medicines showed that the patient improved I have reviewed the patients home medicines and have made adjustments as needed   Social Determinants of Health:  Has access to outpatient care  CRITICAL CARE Performed by: Gloris Manchester   Total critical care time: 32 minutes  Critical care time was exclusive of separately billable procedures and treating other patients.  Critical care was necessary to treat or prevent imminent or life-threatening deterioration.  Critical care was time spent personally by me on the following activities: development of treatment plan with patient and/or surrogate as well as nursing, discussions with consultants, evaluation of patient's response to treatment, examination of patient, obtaining history from patient or surrogate,  ordering and performing treatments and interventions, ordering and review of laboratory studies, ordering and review of radiographic studies, pulse oximetry and re-evaluation of patient's condition.         Final Clinical Impression(s) / ED Diagnoses Final diagnoses:  Symptomatic anemia    Rx / DC Orders ED Discharge Orders          Ordered    ferrous sulfate 324 MG TBEC  Every 48 hours        05/06/23 1909              Gloris Manchester, MD 05/06/23 1910

## 2023-05-06 NOTE — Discharge Instructions (Signed)
A prescription for iron supplement is attached.  Take this every other day.  Keep your gynecology follow-up on June 20.  Return to the emergency department for any new or worsening symptoms of concern.

## 2023-05-06 NOTE — ED Triage Notes (Addendum)
Patient sent by PCP due to hgb 6.7 and recommending IV estrogen and blood transfusion along with iron transfusions.  Patient has had heavy vaginal bleeding. Reports passing clots and complains of fatigue and weakness. Complains of headache and dizziness.

## 2023-05-07 ENCOUNTER — Telehealth: Payer: Self-pay

## 2023-05-07 LAB — BPAM RBC
Blood Product Expiration Date: 202406132359
ISSUE DATE / TIME: 202405131632
Unit Type and Rh: 5100

## 2023-05-07 LAB — TYPE AND SCREEN
ABO/RH(D): O POS
Antibody Screen: NEGATIVE
Unit division: 0

## 2023-05-07 NOTE — Telephone Encounter (Signed)
Patient was discharged from ER after one unit blood transfusion.She is requested a work note to return to her job on Tuesday 05/14/23. She continues to complain of fatigue needing time to recover.  Nicole Cella Cortnie Ringel RN BSN PCCN  [facility] [phone]-cell [phone]-office

## 2023-05-08 ENCOUNTER — Other Ambulatory Visit: Payer: Self-pay | Admitting: Family Medicine

## 2023-05-08 NOTE — Progress Notes (Signed)
Note printed after recent ED visit. To be left up front for patient/Dorothy congregational RN to pick up.  Burley Saver MD

## 2023-05-28 NOTE — Congregational Nurse Program (Signed)
  Dept: [phone]   Congregational Nurse Program Note  Date of Encounter: 05/28/2023  Past Medical History: Past Medical History:  Diagnosis Date   GERD (gastroesophageal reflux disease)    Microcytic anemia 11/21/2020   Obesity     Encounter Details:  CNP Questionnaire - 05/28/23 1633       Questionnaire   Ask client: Do you give verbal consent for me to treat you today? Yes    Student Assistance N/A    Location Patient Served  [facility]    Visit Setting with Client Organization    Patient Status Refugee    Insurance Medicaid    Insurance/Financial Assistance Referral N/A    Medication Have Medication Insecurities;Provided Medication Assistance    Medical Provider Yes    Screening Referrals Made N/A    Medical Referrals Made N/A    Medical Appointment Made N/A    Recently w/o PCP, now 1st time PCP visit completed due to CNs referral or appointment made N/A    Food N/A    Transportation N/A    Housing/Utilities N/A    Interpersonal Safety N/A    Interventions Advocate/Support;Navigate Healthcare System;Case Management;Counsel;Educate;Reviewed Medications    Abnormal to Normal Screening Since Last CN Visit N/A    Screenings CN Performed N/A    Sent Client to Lab for: N/A    Did client attend any of the following based off CNs referral or appointments made? N/A    ED Visit Averted N/A    Life-Saving Intervention Made Yes            Patient called me requesting appointment reminder. Date time and located texted back to patient.  Nicole Cella Francoise Chojnowski RN BSN PCCN  [facility] [phone]-cell [phone]-office

## 2023-06-10 ENCOUNTER — Encounter (HOSPITAL_COMMUNITY): Payer: Self-pay

## 2023-06-10 ENCOUNTER — Ambulatory Visit (HOSPITAL_COMMUNITY)
Admission: EM | Admit: 2023-06-10 | Discharge: 2023-06-10 | Disposition: A | Discharge status: Home / Self Care | Payer: Medicaid Other | Attending: Physician Assistant | Admitting: Physician Assistant

## 2023-06-10 DIAGNOSIS — B3731 Acute candidiasis of vulva and vagina: Secondary | ICD-10-CM | POA: Diagnosis present

## 2023-06-10 LAB — POCT URINALYSIS DIP (MANUAL ENTRY)
Bilirubin, UA: NEGATIVE
Blood, UA: NEGATIVE
Glucose, UA: NEGATIVE mg/dL
Ketones, POC UA: NEGATIVE mg/dL
Leukocytes, UA: NEGATIVE
Nitrite, UA: NEGATIVE
Protein Ur, POC: NEGATIVE mg/dL
Spec Grav, UA: 1.02 (ref 1.010–1.025)
Urobilinogen, UA: 0.2 E.U./dL
pH, UA: 6.5 (ref 5.0–8.0)

## 2023-06-10 MED ORDER — FLUCONAZOLE 150 MG PO TABS: 150.0000 mg | ORAL_TABLET | Freq: Every day | ORAL | 0 refills | Status: AC

## 2023-06-10 NOTE — Discharge Instructions (Signed)
Take diflucan as prescribed Will call with test results and change treatment plan if indicated

## 2023-06-10 NOTE — ED Triage Notes (Addendum)
Patient here today with c/o vaginal itching X 1 week. She has been feeling tired. 2 months ago she saw her PCP for the same symptoms and was given diflucan and the symptoms went away but they's come back. She finished her LMP on 05/31/2023. On the 10th and 12th she had unprotected sex with her boyfriend.

## 2023-06-10 NOTE — ED Provider Notes (Signed)
[facility]    CSN: [id number] Arrival date & time: 06/10/23  1643      History   Chief Complaint Chief Complaint  Patient presents with   Vaginal Itching    HPI Kathy Richards is a 32 y.o. female.   Pt complains of vaginal itching that started several days ago.  Reports she had a yeast infection several months ago, treated with diflucan with resolution of sx.  She denies increased discharge, foul odor, pelvic pain, fever, chills, n/v/d.  LMP 05/31/2023.     Past Medical History:  Diagnosis Date   GERD (gastroesophageal reflux disease)    Microcytic anemia 11/21/2020   Obesity     Patient Active Problem List   Diagnosis Date Noted   H. 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 Authorizing Provider  ferrous sulfate 324 MG TBEC Take 1 tablet (324 mg total) by mouth every other day. 05/06/23  Yes Gloris Manchester, MD  fluconazole (DIFLUCAN) 150 MG tablet Take 1 tablet (150 mg total) by mouth daily for 1 day. Take 1 tablet by mouth 06/10/23 06/11/23 Yes Ward, Tylene Fantasia, PA-C    Family History Family History  Problem Relation Age of Onset   HIV Mother    Hypertension Father     Social History Social History   Tobacco Use   Smoking status: Never   Smokeless tobacco: Never  Vaping Use   Vaping Use: Never used   Substance Use Topics   Alcohol use: Not Currently   Drug use: Never     Allergies   Patient has no known allergies.   Review of Systems Review of Systems  Constitutional:  Negative for chills and fever.  HENT:  Negative for ear pain and sore throat.   Eyes:  Negative for pain and visual disturbance.  Respiratory:  Negative for cough and shortness of breath.   Cardiovascular:  Negative for chest pain and palpitations.  Gastrointestinal:  Negative for abdominal pain and vomiting.  Genitourinary:  Negative for dysuria and hematuria.       Vaginal itching  Musculoskeletal:  Negative for arthralgias and back pain.  Skin:  Negative for color change and rash.  Neurological:  Negative for seizures and syncope.  All other systems reviewed and are negative.    Physical Exam Triage Vital Signs ED Triage Vitals  Enc Vitals Group     BP 06/10/23 1734 135/82     Pulse Rate 06/10/23 1734 73     Resp 06/10/23 1734 16     Temp 06/10/23 1734 98.3 F (  36.8 C)     Temp Source 06/10/23 1734 Oral     SpO2 06/10/23 1734 99 %     Weight 06/10/23 1738 230 lb (104.3 kg)     Height --      Head Circumference --      Peak Flow --      Pain Score 06/10/23 1738 0     Pain Loc --      Pain Edu? --      Excl. in GC? --    No data found.  Updated Vital Signs BP 135/82 (BP Location: Left Arm)   Pulse 73   Temp 98.3 F (36.8 C) (Oral)   Resp 16   Wt 230 lb (104.3 kg)   LMP 05/27/2023 (Exact Date)   SpO2 99%   BMI 34.97 kg/m   Visual Acuity Right Eye Distance:   Left Eye Distance:   Bilateral Distance:    Right Eye Near:   Left Eye Near:    Bilateral Near:     Physical Exam Vitals and nursing note reviewed.  Constitutional:      General: She is not in acute distress.    Appearance: She is well-developed.  HENT:     Head: Normocephalic and atraumatic.  Eyes:     Conjunctiva/sclera: Conjunctivae normal.  Cardiovascular:     Rate and Rhythm: Normal rate and regular rhythm.      Heart sounds: No murmur heard. Pulmonary:     Effort: Pulmonary effort is normal. No respiratory distress.     Breath sounds: Normal breath sounds.  Abdominal:     Palpations: Abdomen is soft.     Tenderness: There is no abdominal tenderness.  Musculoskeletal:        General: No swelling.     Cervical back: Neck supple.  Skin:    General: Skin is warm and dry.     Capillary Refill: Capillary refill takes less than 2 seconds.  Neurological:     Mental Status: She is alert.  Psychiatric:        Mood and Affect: Mood normal.      UC Treatments / Results  Labs (all labs ordered are listed, but only abnormal results are displayed) Labs Reviewed  POCT URINALYSIS DIP (MANUAL ENTRY)  CERVICOVAGINAL ANCILLARY ONLY    EKG   Radiology No results found.  Procedures Procedures (including critical care time)  Medications Ordered in UC Medications - No data to display  Initial Impression / Assessment and Plan / UC Course  I have reviewed the triage vital signs and the nursing notes.  Pertinent labs & imaging results that were available during my care of the patient were reviewed by me and considered in my medical decision making (see chart for details).     Will treat for vaginal yeast infection.  Cervicovaginal self swab in clinic today, will change treatment plan if indicated.  Final Clinical Impressions(s) / UC Diagnoses   Final diagnoses:  Vaginal yeast infection     Discharge Instructions      Take diflucan as prescribed Will call with test results and change treatment plan if indicated   ED Prescriptions     Medication Sig Dispense Auth. Provider   fluconazole (DIFLUCAN) 150 MG tablet Take 1 tablet (150 mg total) by mouth daily for 1 day. Take 1 tablet by mouth 1 tablet Ward, Tylene Fantasia, PA-C      PDMP not reviewed this encounter.   Ward, Tylene Fantasia, PA-C 06/10/23 1826

## 2023-06-11 LAB — CERVICOVAGINAL ANCILLARY ONLY
Bacterial Vaginitis (gardnerella): NEGATIVE
Candida Glabrata: NEGATIVE
Candida Vaginitis: NEGATIVE
Chlamydia: NEGATIVE
Comment: NEGATIVE
Comment: NEGATIVE
Comment: NEGATIVE
Comment: NEGATIVE
Comment: NEGATIVE
Comment: NORMAL
Neisseria Gonorrhea: NEGATIVE
Trichomonas: NEGATIVE

## 2023-06-13 ENCOUNTER — Ambulatory Visit (INDEPENDENT_AMBULATORY_CARE_PROVIDER_SITE_OTHER): Payer: Medicaid Other | Admitting: Obstetrics and Gynecology

## 2023-06-13 VITALS — BP 116/76 | HR 76 | Wt 244.2 lb

## 2023-06-13 DIAGNOSIS — Z3169 Encounter for other general counseling and advice on procreation: Secondary | ICD-10-CM

## 2023-06-13 DIAGNOSIS — N939 Abnormal uterine and vaginal bleeding, unspecified: Secondary | ICD-10-CM

## 2023-06-13 MED ORDER — TRANEXAMIC ACID 650 MG PO TABS
1300.0000 mg | ORAL_TABLET | Freq: Three times a day (TID) | ORAL | 2 refills | Status: DC
Start: 2023-06-13 — End: 2023-08-20

## 2023-06-13 NOTE — Patient Instructions (Signed)
Ovulation predictor kit and use an app on your phone to track your period

## 2023-06-13 NOTE — Progress Notes (Signed)
GYNECOLOGY VISIT  Patient name: Kathy Richards MRN [id number]  Date of birth: 16-Mar-1991 Chief Complaint:   Vaginal Bleeding (Bleeding started 5/31-05/30/03. Bleeding continues after menstral cycle and last like a normal cycle. Patient stated she gets blood clots and sometimes the cycles come twice in one month )  History:  Kathy Richards is a 32 y.o. G3P3001 being seen today for AUB.  Marland Kitchen Having bleeding since May with clots, nonpainful and no bleeding today. LMP 6/3 Was previously on birth control to help her period. Stopped taking the birth cotnrol due to feeling some dizziness. Most recent pads, using larg pad and in a few minutes would fill the pad. Currently to get pregnant since last year. Did not need medication  Has not skipped menses - would miss a period when on birth control Will have a menses on the first week or last week of the month  May not have bleeding for 2 weeks  Different partner than with prior pregnancy Initially stated he has not fathered children then noted he has a child w/ a different partner He is not a smoker, 21 yo, no semen testing completed Has not done any OPKs at home, tracks menses on calendar but reports they have been unpredictable (reports no consistent day that it will start but also denies missing a menses)    Past Medical History:  Diagnosis Date   GERD (gastroesophageal reflux disease)    Microcytic anemia 11/21/2020   Obesity     Past Surgical History:  Procedure Laterality Date   CESAREAN SECTION     x2    The following portions of the patient's history were reviewed and updated as appropriate: allergies, current medications, past family history, past medical history, past social history, past surgical history and problem list.   Health Maintenance:   Last pap     Component Value Date/Time   DIAGPAP  11/21/2020 1101    - Negative for intraepithelial lesion or malignancy (NILM)   ADEQPAP  11/21/2020 1101    Satisfactory for  evaluation; transformation zone component ABSENT.   Last mammogram: N/A   Review of Systems:  Pertinent items are noted in HPI. Comprehensive review of systems was otherwise negative.   Objective:  Physical Exam BP 116/76 (BP Location: Left Arm, Patient Position: Sitting, Cuff Size: Large)   Pulse 76   Wt 244 lb 3.2 oz (110.8 kg)   LMP 05/27/2023 (Exact Date)   BMI 37.13 kg/m    Physical Exam Vitals and nursing note reviewed.  Constitutional:      Appearance: Normal appearance.  HENT:     Head: Normocephalic and atraumatic.  Pulmonary:     Effort: Pulmonary effort is normal.  Skin:    General: Skin is warm and dry.  Neurological:     General: No focal deficit present.     Mental Status: She is alert.  Psychiatric:        Mood and Affect: Mood normal.        Behavior: Behavior normal.        Thought Content: Thought content normal.        Judgment: Judgment normal.         Assessment & Plan:   1. Abnormal uterine bleeding Patient has abnormal uterine bleeding as evidenced by anemia. Will order abnormal uterine bleeding evaluation labs and pelvic ultrasound to evaluate for any structural gynecologic abnormalities.  Will contact patient with these results and plans for further evaluation/management. Offered lysted for menstrual flow  concern though noted that it will not regulate her cycle it will also not act as a contraceptive. Patient very sure she does not want contraceptive at this time since she is trying to conceive.  - Testosterone,Free and Total - TSH Rfx on Abnormal to Free T4 - Prolactin - US PELVIC COMPLETE WITH TRANSVAGINAL; Future - tranexamic acid (LYSTEDA) 650 MG TABS tablet; Take 2 tablets (1,300 mg total) by mouth 3 (three) times daily. Take during menses for a maximum of five days  Dispense: 30 tablet; Refill: 2  2. Infertility counseling Unclear etiology of infertility though having partner away for prolonged periods of time, menstrual  irregularities and unclear ovulatory status, likely multifactorial. Likely 21 day cycles since describing two menses a month, will obtain progesterone level today. AMH for ovarian reserve. Noted that partner may need semen analysis. Will first obtain US for AUB and if testing demonstrates ovulation will plan for HSG to assess tubal patency.  Recommend use of OPK at home as well to better time intercourse  - Progesterone - Anti mullerian hormone   Lorriane Shire, MD [facility]necologic [facility] Center for [facility], [facility]

## 2023-06-19 ENCOUNTER — Ambulatory Visit (HOSPITAL_COMMUNITY): Admission: RE | Admit: 2023-06-19 | Payer: Medicaid Other | Source: Ambulatory Visit

## 2023-06-20 ENCOUNTER — Other Ambulatory Visit: Payer: Self-pay | Admitting: Obstetrics and Gynecology

## 2023-06-20 ENCOUNTER — Telehealth: Payer: Self-pay

## 2023-06-20 DIAGNOSIS — R7989 Other specified abnormal findings of blood chemistry: Secondary | ICD-10-CM

## 2023-06-20 LAB — TSH RFX ON ABNORMAL TO FREE T4: TSH: 8.68 u[IU]/mL — ABNORMAL HIGH (ref 0.450–4.500)

## 2023-06-20 LAB — ANTI MULLERIAN HORMONE: ANTI-MULLERIAN HORMONE (AMH): 2.76 ng/mL

## 2023-06-20 LAB — PROLACTIN: Prolactin: 52.1 ng/mL — ABNORMAL HIGH (ref 4.8–33.4)

## 2023-06-20 LAB — TESTOSTERONE,FREE AND TOTAL
Testosterone, Free: <0.2 pg/mL (ref 0.0–4.2)
Testosterone: <3 ng/dL — ABNORMAL LOW (ref 13–71)

## 2023-06-20 LAB — PROGESTERONE: Progesterone: 5.4 ng/mL

## 2023-06-20 LAB — T4F: T4,Free (Direct): 1.21 ng/dL (ref 0.82–1.77)

## 2023-06-20 NOTE — Telephone Encounter (Addendum)
-----   Message from Lorriane Shire, MD sent at 06/20/2023  1:14 PM EDT ----- Notify that prolactin elevated and thyroid function hormone elevated and may be cause of bleeding changes - referral to endocrine place.    Called pt with [facility] interpreter ID [phone]. Results and referral location reviewed. Contact information given for [facility]. Was unable to keep [place] appt; pt given centralized scheduling number to reschedule.

## 2023-06-20 NOTE — Congregational Nurse Program (Signed)
  Dept: [phone]   Congregational Nurse Program Note  Date of Encounter: 06/20/2023  Past Medical History: Past Medical History:  Diagnosis Date   GERD (gastroesophageal reflux disease)    Microcytic anemia 11/21/2020   Obesity     Encounter Details:  CNP Questionnaire - 06/20/23 0854       Questionnaire   Ask client: Do you give verbal consent for me to treat you today? Yes    Student Assistance N/A    Location Patient Served  [facility]    Visit Setting with Client Telehealth;Phone/Text/Email    Patient Status Refugee    Insurance Medicaid    Insurance/Financial Assistance Referral N/A    Medication Have Medication Insecurities;Provided Medication Assistance    Medical Provider Yes    Screening Referrals Made N/A    Medical Referrals Made N/A    Medical Appointment Made N/A    Recently w/o PCP, now 1st time PCP visit completed due to CNs referral or appointment made N/A    Food N/A    Transportation N/A    Housing/Utilities N/A    Interpersonal Safety N/A    Interventions Advocate/Support;Navigate Healthcare System;Case Management;Counsel;Educate;Reviewed Medications    Abnormal to Normal Screening Since Last CN Visit N/A    Screenings CN Performed N/A    Sent Client to Lab for: N/A    Did client attend any of the following based off CNs referral or appointments made? N/A    ED Visit Averted N/A    Life-Saving Intervention Made Yes            Patient called me with questions regarding Tranexamic medication. She does not know how to take the medication.  Per Notes, patient was  instructed to take 2 tablets 3 times a day during menses maximum of 5 days.Patient verbalized understanding.  Nicole Cella Mayelin Panos RN BSN PCCN  [facility] [phone]-cell [phone]-office

## 2023-07-01 ENCOUNTER — Encounter: Payer: Self-pay | Admitting: Family Medicine

## 2023-07-01 ENCOUNTER — Ambulatory Visit (INDEPENDENT_AMBULATORY_CARE_PROVIDER_SITE_OTHER): Payer: Medicaid Other | Admitting: Family Medicine

## 2023-07-01 VITALS — BP 126/71 | HR 69 | Wt 247.0 lb

## 2023-07-01 DIAGNOSIS — M25569 Pain in unspecified knee: Secondary | ICD-10-CM | POA: Insufficient documentation

## 2023-07-01 DIAGNOSIS — Z3169 Encounter for other general counseling and advice on procreation: Secondary | ICD-10-CM | POA: Diagnosis not present

## 2023-07-01 DIAGNOSIS — M25561 Pain in right knee: Secondary | ICD-10-CM | POA: Diagnosis not present

## 2023-07-01 NOTE — Assessment & Plan Note (Addendum)
Patient with soft tissue injury on right side of right patella. Unlikely fracture or meniscal tear as patient has started to improve, and no instability.  - Continue with supportive measures such as ice and compression wrap as needed while standing during work  - Return to clinic if pain does not improve or worsens and will consider xray at that time.

## 2023-07-01 NOTE — Assessment & Plan Note (Signed)
Patient continues to have issues with infertility. Has seen Gyn. Gyn's note mentions further workup including HCG.  - Referral to follow up initial infertility workup  - Continue progesterone

## 2023-07-01 NOTE — Patient Instructions (Addendum)
I am glad your knee is starting to feel better. I recommend using ice intermittently to help with the swelling and pain. You can also use ibuprofen for pain and inflammation as well.   While you are standing for long periods of time at work I recommend using a compression band (not too tight) so that the fluid won't settle there. Take off the band after work.   Please return if your knee does not improve.

## 2023-07-01 NOTE — Progress Notes (Signed)
    SUBJECTIVE:   CHIEF COMPLAINT / HPI:   Knee Pain  Patient says that she fell walking up the stairs at work two weeks ago. Reports that her knee would hurt walking up stairs but has improved in the last two weeks. Tells me her work wanted her to visit the doctor though she felt good enough to keep working. She does not want workers compensation, but wants to be evaluated to see if she will be able to continue work. Denies any clicking, locking, or instability with walking. Says she stands for 10 hours a day during work. She has been intermittently icing her knee at work and reports this helps her symptoms.   Infertility  Patient [facility] and was told she "has something in her uterus that's not a baby". She says she was told that she was supposed to follow up with her PCP. She continues to take progesterone daily. Still having problems with infertility.   PERTINENT  PMH / PSH: AUB, Latent TB, Obesity   OBJECTIVE:   BP 126/71   Pulse 69   Wt 247 lb (112 kg)   LMP 06/20/2023 (Exact Date)   SpO2 100%   BMI 37.56 kg/m   General: well appearing, in no acute distress CV: RRR, radial pulses equal and palpable, no BLE edema  Resp: Normal work of breathing on room air, CTAB Abd: Soft, non tender, non distended  Neuro: Alert & Oriented x 4  MSK: right knee with some edema on lateral anterior side of patella. No ecchymosis. Tenderness to palpation, negative anterior draw, no pain with valgus or varus maneuver. Normal gait, no locking or clicking    ASSESSMENT/PLAN:   Infertility counseling Assessment & Plan: Patient continues to have issues with infertility. Has seen Gyn. Gyn's note mentions further workup including HCG.  - Referral to follow up initial infertility workup  - Continue progesterone   Orders: -     Ambulatory referral to Obstetrics / Gynecology  Acute pain of right knee Assessment & Plan: Patient with soft  tissue injury on right side of right patella. Unlikely fracture or meniscal tear as patient has started to improve, and no instability.  - Continue with supportive measures such as ice and compression wrap as needed while standing during work  - Return to clinic if pain does not improve or worsens and will consider xray at that time.                Lockie Mola, MD [facility]

## 2023-08-20 ENCOUNTER — Encounter: Payer: Self-pay | Admitting: Family Medicine

## 2023-08-20 ENCOUNTER — Ambulatory Visit (INDEPENDENT_AMBULATORY_CARE_PROVIDER_SITE_OTHER): Payer: Medicaid Other | Admitting: Family Medicine

## 2023-08-20 ENCOUNTER — Other Ambulatory Visit: Payer: Self-pay

## 2023-08-20 VITALS — BP 122/79 | HR 94 | Ht 68.0 in | Wt 241.1 lb

## 2023-08-20 DIAGNOSIS — N939 Abnormal uterine and vaginal bleeding, unspecified: Secondary | ICD-10-CM

## 2023-08-20 DIAGNOSIS — Z3169 Encounter for other general counseling and advice on procreation: Secondary | ICD-10-CM

## 2023-08-20 DIAGNOSIS — D649 Anemia, unspecified: Secondary | ICD-10-CM

## 2023-08-20 DIAGNOSIS — R7989 Other specified abnormal findings of blood chemistry: Secondary | ICD-10-CM | POA: Diagnosis not present

## 2023-08-20 DIAGNOSIS — N922 Excessive menstruation at puberty: Secondary | ICD-10-CM

## 2023-08-20 MED ORDER — TRANEXAMIC ACID 650 MG PO TABS: 1300.0000 mg | ORAL_TABLET | Freq: Three times a day (TID) | ORAL | 2 refills | Status: DC

## 2023-08-20 NOTE — Patient Instructions (Addendum)
Please download an app to track your periods (can use Apple Health app, Flo, Cycles, PeriodTracker)  You can use Ovulation Prediction Kits that you can find at the pharmacy to help determine the best times to attempt conception

## 2023-08-20 NOTE — Progress Notes (Signed)
GYNECOLOGY OFFICE VISIT NOTE  History:   Kathy Richards is a 32 y.o. G3P2011 here today for preconception counseling.  Pt presents today for preconception counseling. She has a history of a term SVD in 2015, a SAB at 2020, and one term C/S (due to LGA) at 2022 -- neonatal demise 2/2 omphalitis. She has been trying to conceive since June 2023, but has not been successful. She has been attempting conception with new partner, who lives in [place], so is not always able to successfully attempt conception every month. She has not been using OPK to help with timing of attempts. She reports her periods appear abnormal, typically 2x a month. She has struggled with HMB since menarche, often passes large clots, and has required blood transfusion in past. She is currently taking PO Fe, but still continues to feel dizzy and winded with minimal exertion.   Health Maintenance Due  Topic Date Due   COVID-19 Vaccine (3 - 2023-24 season) 08/24/2022   INFLUENZA VACCINE  07/25/2023    Past Medical History:  Diagnosis Date   GERD (gastroesophageal reflux disease)    Microcytic anemia 11/21/2020   Obesity     Past Surgical History:  Procedure Laterality Date   CESAREAN SECTION     x2    The following portions of the patient's history were reviewed and updated as appropriate: allergies, current medications, past family history, past medical history, past social history, past surgical history and problem list.   Health Maintenance:   Last pap: Lab Results  Component Value Date   DIAGPAP  11/21/2020    - Negative for intraepithelial lesion or malignancy (NILM)    Review of Systems:  Pertinent items noted in HPI and remainder of comprehensive ROS otherwise negative.  Physical Exam:  BP 122/79   Pulse 94   Ht 5\' 8"  (1.727 m)   Wt 241 lb 1.6 oz (109.4 kg)   LMP 08/04/2023 (Exact Date)   BMI 36.66 kg/m  CONSTITUTIONAL: Well-developed, well-nourished female in no acute distress.  HEENT:   Normocephalic, atraumatic. External right and left ear normal. No scleral icterus.  NECK: Normal range of motion, supple, no masses noted on observation SKIN: No rash noted. Not diaphoretic. No erythema. No pallor. MUSCULOSKELETAL: Normal range of motion. No edema noted. NEUROLOGIC: Alert and oriented to person, place, and time. Normal muscle tone coordination.  PSYCHIATRIC: Normal mood and affect. Normal behavior. Normal judgment and thought content. RESPIRATORY: Effort normal, no problems with respiration noted  Labs and Imaging No results found for this or any previous visit (from the past 168 hour(s)). No results found.    Assessment and Plan:   Anemia, unspecified type  Abnormal uterine bleeding  Excessive menstruation at puberty Last HgB on file 6.7  likely iso HMB, will send Lysteda for pt to use during menses  d/w pt importance of Fe supplements, iron rich foods, would consider IV iron based on levels  Elevated TSH On last test, FT4 normal  will repeat  Elevated prolactin level On last test, will repeat fasting  having mild headaches, but suspect this is more related to anemia/dehydration than true prolactinoma, esp in absence of other sxs such as vision changes  would have low threshold for imaging though  Encounter for preconception consultation D/w pt use of OPKs, tracking periods, starting PNVs  will complete w/up as above  pelvic U/S in 2023 without any evidence of structural issues causing difficulty with conception  if labs unremarkable or once these issues are  better managed if pt still having difficulty with conception, would consider repeat pelvic U/S and/or HSG for tubal patency and semen analysis for partner  f/up in 6-8 wks    Please refer to After Visit Summary for other counseling recommendations.   Return in about 6 weeks (around 10/01/2023) for MD only, follow up preconception counseling.    Sundra Aland, MD OB Fellow, Faculty Practice [facility],  Center for [facility]

## 2023-08-21 ENCOUNTER — Encounter (HOSPITAL_COMMUNITY): Payer: Self-pay

## 2023-08-21 ENCOUNTER — Other Ambulatory Visit: Payer: Self-pay | Admitting: Family Medicine

## 2023-08-21 ENCOUNTER — Ambulatory Visit (HOSPITAL_COMMUNITY)
Admission: EM | Admit: 2023-08-21 | Discharge: 2023-08-21 | Disposition: A | Discharge status: Home / Self Care | Payer: Medicaid Other | Attending: Physician Assistant | Admitting: Physician Assistant

## 2023-08-21 DIAGNOSIS — Z113 Encounter for screening for infections with a predominantly sexual mode of transmission: Secondary | ICD-10-CM

## 2023-08-21 DIAGNOSIS — R5383 Other fatigue: Secondary | ICD-10-CM | POA: Diagnosis not present

## 2023-08-21 DIAGNOSIS — D500 Iron deficiency anemia secondary to blood loss (chronic): Secondary | ICD-10-CM

## 2023-08-21 DIAGNOSIS — N898 Other specified noninflammatory disorders of vagina: Secondary | ICD-10-CM

## 2023-08-21 LAB — CBC
HCT: 28.5 % — ABNORMAL LOW (ref 36.0–46.0)
Hematocrit: 29.4 % — ABNORMAL LOW (ref 34.0–46.6)
Hemoglobin: 8.2 g/dL — ABNORMAL LOW (ref 11.1–15.9)
Hemoglobin: 7.9 g/dL — ABNORMAL LOW (ref 12.0–15.0)
MCH: 17.2 pg — ABNORMAL LOW (ref 26.6–33.0)
MCH: 17.3 pg — ABNORMAL LOW (ref 26.0–34.0)
MCHC: 27.9 g/dL — ABNORMAL LOW (ref 31.5–35.7)
MCHC: 27.7 g/dL — ABNORMAL LOW (ref 30.0–36.0)
MCV: 62 fL — ABNORMAL LOW (ref 79–97)
MCV: 62.5 fL — ABNORMAL LOW (ref 80.0–100.0)
Platelets: 300 10*3/uL (ref 150–450)
Platelets: 278 10*3/uL (ref 150–400)
RBC: 4.76 x10E6/uL (ref 3.77–5.28)
RBC: 4.56 MIL/uL (ref 3.87–5.11)
RDW: 17.8 % — ABNORMAL HIGH (ref 11.7–15.4)
RDW: 18.7 % — ABNORMAL HIGH (ref 11.5–15.5)
WBC: 5.2 10*3/uL (ref 3.4–10.8)
WBC: 4.3 10*3/uL (ref 4.0–10.5)
nRBC: 0 % (ref 0.0–0.2)

## 2023-08-21 LAB — T4, FREE: Free T4: 0.83 ng/dL (ref 0.61–1.12)

## 2023-08-21 LAB — POCT URINALYSIS DIP (MANUAL ENTRY)
Bilirubin, UA: NEGATIVE
Blood, UA: NEGATIVE
Glucose, UA: NEGATIVE mg/dL
Ketones, POC UA: NEGATIVE mg/dL
Leukocytes, UA: NEGATIVE
Nitrite, UA: NEGATIVE
Protein Ur, POC: NEGATIVE mg/dL
Spec Grav, UA: >=1.03 — AB (ref 1.010–1.025)
Urobilinogen, UA: 0.2 E.U./dL
pH, UA: 5.5 (ref 5.0–8.0)

## 2023-08-21 LAB — COMPREHENSIVE METABOLIC PANEL
ALT: 12 U/L (ref 0–44)
AST: 14 U/L — ABNORMAL LOW (ref 15–41)
Albumin: 3.6 g/dL (ref 3.5–5.0)
Alkaline Phosphatase: 96 U/L (ref 38–126)
Anion gap: 7 (ref 5–15)
BUN: 7 mg/dL (ref 6–20)
CO2: 21 mmol/L — ABNORMAL LOW (ref 22–32)
Calcium: 9 mg/dL (ref 8.9–10.3)
Chloride: 107 mmol/L (ref 98–111)
Creatinine, Ser: 0.72 mg/dL (ref 0.44–1.00)
GFR, Estimated: >60 mL/min (ref >60)
Glucose, Bld: 104 mg/dL — ABNORMAL HIGH (ref 70–99)
Potassium: 3.7 mmol/L (ref 3.5–5.1)
Sodium: 135 mmol/L (ref 135–145)
Total Bilirubin: <0.1 mg/dL — ABNORMAL LOW (ref 0.3–1.2)
Total Protein: 7.7 g/dL (ref 6.5–8.1)

## 2023-08-21 LAB — POCT URINE PREGNANCY: Preg Test, Ur: NEGATIVE

## 2023-08-21 LAB — TSH: TSH: 2.174 u[IU]/mL (ref 0.350–4.500)

## 2023-08-21 MED ORDER — FLUCONAZOLE 150 MG PO TABS: 150.0000 mg | ORAL_TABLET | ORAL | 0 refills | Status: AC

## 2023-08-21 NOTE — Discharge Instructions (Addendum)
We are treating you for a vaginal yeast infection.  Take fluconazole today and a second dose in 3 days.  We will contact you if any of your testing is abnormal and we need to start a different treatment.  Wear loosefitting cotton underwear and use hypoallergenic soaps and detergents.  If anything worsens and you have additional discharge, pelvic pain, fever, nausea, vomiting you need to be seen immediately.  I believe that your fatigue is related to your chronic anemia but we have obtained some blood work to investigate this since it is worsening.  Please follow-up with your primary care soon as possible.  Try to get 8+ hours of sleep per night.  If you have any worsening symptoms including weakness, heart racing, chest pain, shortness of breath you need to be seen immediately.

## 2023-08-21 NOTE — Progress Notes (Signed)
HgB 8.2, given symptomatic, would recommend iron infusions, will have clinical team call with results and work on setting up IV iron infusions.

## 2023-08-21 NOTE — ED Provider Notes (Signed)
[facility]    CSN: [id number] Arrival date & time: 08/21/23  1057      History   Chief Complaint Chief Complaint  Patient presents with   Vaginal Itching    HPI Kathy Richards is a 32 y.o. female.   Patient present today with several concerns.  Her primary concern today is a weeklong history of vaginal irritation with associated discharge.  She describes the discharge as thick and white.  She reports associated intermittent discomfort and itching.  Denies any changes to personal hygiene products including soaps or detergents.  Denies any recent medication changes or antibiotic use.  She has not tried any over-the-counter medication for symptom management.  She is sexually active with female partners and has no specific concern for STI but is open to testing.  She denies any history of diabetes does not take SGLT2 inhibitor.  In addition, patient reports increasing fatigue.  She has a history of chronic microcytic anemia and is followed by OB/GYN. She reports compliance with iron supplementation.  She had blood counts yesterday that showed hemoglobin of 8.2.  Reports that despite this level she continues to feel very tired and is having some shortness of breath with activity.  She has heavy menstrual cycles currently managed by progesterone and tranexamic acid.  She reports sleeping 6+ hours per night.  Denies any recent illness or additional symptoms including cough or congestion.    Past Medical History:  Diagnosis Date   GERD (gastroesophageal reflux disease)    Microcytic anemia 11/21/2020   Obesity     Patient Active Problem List   Diagnosis Date Noted   Iron deficiency anemia due to chronic blood loss 08/21/2023   Knee pain 07/01/2023   H. 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 Authorizing Provider  fluconazole (DIFLUCAN) 150 MG tablet Take 1 tablet (150 mg total) by mouth every 3 (three) days for 2 doses. 08/21/23 08/25/23 Yes Akshara Blumenthal K, PA-C  ferrous sulfate 324 MG TBEC Take 1 tablet (324 mg total) by mouth every other day. 05/06/23   Gloris Manchester, MD  progesterone (PROMETRIUM) 100 MG capsule Take 100 mg by mouth daily.    [provider]  tranexamic acid (LYSTEDA) 650 MG TABS tablet Take 2 tablets (1,300 mg total) by mouth 3 (three) times daily. Take during menses for a maximum of five days 08/20/23   Sundra Aland, MD    Family History Family History  Problem Relation Age of Onset   HIV Mother    Hypertension Father     Social History Social History   Tobacco Use   Smoking status: Never   Smokeless tobacco: Never  Vaping Use   Vaping status: Never Used  Substance Use Topics   Alcohol use: Not Currently   Drug use: Never     Allergies   Patient has no known allergies.   Review of Systems Review of Systems  Constitutional:  Positive for fatigue. Negative for activity change, appetite change and fever.  Respiratory:  Positive for shortness of breath. Negative for cough.   Cardiovascular:  Negative for chest pain.  Gastrointestinal:  Negative for abdominal pain, diarrhea, nausea and vomiting.  Genitourinary:  Positive for vaginal discharge and vaginal pain. Negative for dysuria, frequency, pelvic pain, urgency and vaginal bleeding.  Neurological:  Negative for dizziness, light-headedness and headaches.     Physical Exam Triage Vital Signs ED Triage Vitals   Encounter Vitals Group     BP 08/21/23 1155 108/72     Systolic BP Percentile --      Diastolic BP Percentile --      Pulse Rate 08/21/23 1155 77     Resp 08/21/23 1155 16     Temp 08/21/23 1155 98 F (36.7 C)     Temp Source 08/21/23 1155 Oral     SpO2 08/21/23 1155 99 %     Weight 08/21/23 1154 239 lb (108.4 kg)     Height --      Head Circumference --      Peak Flow --      Pain Score 08/21/23 1154 0     Pain Loc --      Pain Education --      Exclude from Growth Chart --    No data found.  Updated Vital Signs BP 108/72 (BP Location: Left Arm)   Pulse 77   Temp 98 F (36.7 C) (Oral)   Resp 16   Wt 239 lb (108.4 kg)   LMP 08/04/2023 (Exact Date)   SpO2 99%   BMI 36.34 kg/m   Visual Acuity Right Eye Distance:   Left Eye Distance:   Bilateral Distance:    Right Eye Near:   Left Eye Near:    Bilateral Near:     Physical Exam Vitals reviewed.  Constitutional:      General: She is awake. She is not in acute distress.    Appearance: Normal appearance. She is well-developed. She is not ill-appearing.     Comments: Very pleasant female appears stated age in no acute distress sitting comfortably in exam room  HENT:     Head: Normocephalic and atraumatic.  Cardiovascular:     Rate and Rhythm: Normal rate and regular rhythm.     Heart sounds: Normal heart sounds, S1 normal and S2 normal. No murmur heard. Pulmonary:     Effort: Pulmonary effort is normal.     Breath sounds: Normal breath sounds. No wheezing, rhonchi or rales.     Comments: Clear to auscultation bilaterally Abdominal:     General: Bowel sounds are normal.     Palpations: Abdomen is soft.     Tenderness: There is no abdominal tenderness. There is no right CVA tenderness, left CVA tenderness, guarding or rebound.     Comments: Benign abdominal exam  Genitourinary:    Comments: Exam deferred Psychiatric:        Behavior: Behavior is cooperative.      UC Treatments / Results  Labs (all labs  ordered are listed, but only abnormal results are displayed) Labs Reviewed  POCT URINALYSIS DIP (MANUAL ENTRY) - Abnormal; Notable for the following components:  Result Value   Spec Grav, UA >=1.030 (*)    All other components within normal limits  COMPREHENSIVE METABOLIC PANEL  CBC  TSH  T4, FREE  POCT URINE PREGNANCY  CERVICOVAGINAL ANCILLARY ONLY    EKG   Radiology No results found.  Procedures Procedures (including critical care time)  Medications Ordered in UC Medications - No data to display  Initial Impression / Assessment and Plan / UC Course  I have reviewed the triage vital signs and the nursing notes.  Pertinent labs & imaging results that were available during my care of the patient were reviewed by me and considered in my medical decision making (see chart for details).     Patient is well-appearing, afebrile, nontoxic, nontachycardic.  Urine pregnancy was negative.  UA showed concentrated urine with no evidence of infection.  Given her clinical presentation suspect vaginal yeast infection.  STI swab was collected and is pending.  We will treat empirically with Diflucan 1 dose today and a second dose in 72 hours if her symptoms persist.  Discussed that we will contact her if need to arrange additional treatment based on her swab results.  Offered HIV and syphilis testing which she declined.  She is to wear loosefitting cotton underwear and use hypoallergenic soaps and detergents.  We discussed that if she has any worsening or changing symptoms including pelvic pain, abdominal pain, fever, nausea, vomiting she needs to be seen immediately.  Patient reports significantly worsening fatigue.  She does have a history of chronic anemia and was last seen by her OB/GYN yesterday at which point she had blood counts that showed improved anemia with a hemoglobin of 8.2.  Looks like additional testing was ordered but not obtained.  Discussed that typically would not recheck  hemoglobin or other testing at this time as I suspect her symptoms are related to chronic anemia but she reports they have worsened and she is concerned about these increasing symptoms.  Ultimately agreed to rechecking blood counts as well as a CMP and thyroid studies.  She was encouraged to follow sleep hygiene procedure to ensure that she is getting adequate rest.  Recommended close follow-up with her primary care/OB/GYN for further evaluation and management assuming no significant abnormalities on blood work.  Discussed that if anything worsens and she has worsening fatigue, shortness of breath, palpitations, weakness, lightheadedness or near syncope she needs to be seen emergently.  Final Clinical Impressions(s) / UC Diagnoses   Final diagnoses:  Vaginal discharge  Vaginal irritation  Screening examination for STI  Fatigue, unspecified type  Anemia due to chronic blood loss     Discharge Instructions      We are treating you for a vaginal yeast infection.  Take fluconazole today and a second dose in 3 days.  We will contact you if any of your testing is abnormal and we need to start a different treatment.  Wear loosefitting cotton underwear and use hypoallergenic soaps and detergents.  If anything worsens and you have additional discharge, pelvic pain, fever, nausea, vomiting you need to be seen immediately.  I believe that your fatigue is related to your chronic anemia but we have obtained some blood work to investigate this since it is worsening.  Please follow-up with your primary care soon as possible.  Try to get 8+ hours of sleep per night.  If you have any worsening symptoms including weakness, heart racing, chest pain, shortness of breath you need to be seen immediately.  ED Prescriptions     Medication Sig Dispense Auth. Provider   fluconazole (DIFLUCAN) 150 MG tablet Take 1 tablet (150 mg total) by mouth every 3 (three) days for 2 doses. 2 tablet Romani Wilbon, Noberto Retort, PA-C       PDMP not reviewed this encounter.   Jeani Hawking, PA-C 08/21/23 1237

## 2023-08-21 NOTE — ED Triage Notes (Signed)
Patient here today with c/o vaginal itching, discharge, and fatigue X 1 weeks.

## 2023-08-22 ENCOUNTER — Telehealth: Payer: Self-pay

## 2023-08-22 ENCOUNTER — Other Ambulatory Visit: Payer: Self-pay

## 2023-08-22 LAB — CERVICOVAGINAL ANCILLARY ONLY
Bacterial Vaginitis (gardnerella): NEGATIVE
Candida Glabrata: NEGATIVE
Candida Vaginitis: NEGATIVE
Chlamydia: NEGATIVE
Comment: NEGATIVE
Comment: NEGATIVE
Comment: NEGATIVE
Comment: NEGATIVE
Comment: NEGATIVE
Comment: NORMAL
Neisseria Gonorrhea: NEGATIVE
Trichomonas: NEGATIVE

## 2023-08-22 NOTE — Progress Notes (Signed)
Opened in error. Maureen Ralphs RN on 08/22/23 at [phone]

## 2023-08-22 NOTE — Telephone Encounter (Addendum)
-----   Message ----- From: Sundra Aland, MD Sent: 08/21/2023  11:27 AM EDT To: [facility] Subject: IV iron                                        Hi!   This patient was quite anemic- I discussed with her the possbility of needing to start IV iron, and I was wondering if you could call her and let her know that I would recommend it for her? I'll place the outpatient therapy plan!  Thanks, Agi Morene Antu pt with [facility] interpreter ID [phone]. Reviewed results and recommendation. [facility] next available 09/03/23; due to days to next appt will send to [facility]. Therapy plan forwarded to [facility] [employer] for scheduling.

## 2023-08-23 ENCOUNTER — Other Ambulatory Visit: Payer: Self-pay

## 2023-08-23 DIAGNOSIS — Z3169 Encounter for other general counseling and advice on procreation: Secondary | ICD-10-CM

## 2023-08-28 ENCOUNTER — Encounter (HOSPITAL_COMMUNITY): Payer: Self-pay | Admitting: Family Medicine

## 2023-09-03 ENCOUNTER — Other Ambulatory Visit: Payer: Self-pay

## 2023-09-03 DIAGNOSIS — D649 Anemia, unspecified: Secondary | ICD-10-CM

## 2023-09-03 DIAGNOSIS — Z3169 Encounter for other general counseling and advice on procreation: Secondary | ICD-10-CM

## 2023-09-03 DIAGNOSIS — R7989 Other specified abnormal findings of blood chemistry: Secondary | ICD-10-CM

## 2023-09-04 ENCOUNTER — Telehealth: Payer: Self-pay

## 2023-09-04 LAB — BASIC METABOLIC PANEL
BUN/Creatinine Ratio: 8 — ABNORMAL LOW (ref 9–23)
BUN: 5 mg/dL — ABNORMAL LOW (ref 6–20)
CO2: 21 mmol/L (ref 20–29)
Calcium: 9.3 mg/dL (ref 8.7–10.2)
Chloride: 104 mmol/L (ref 96–106)
Creatinine, Ser: 0.65 mg/dL (ref 0.57–1.00)
Glucose: 100 mg/dL — ABNORMAL HIGH (ref 70–99)
Potassium: 4 mmol/L (ref 3.5–5.2)
Sodium: 137 mmol/L (ref 134–144)
eGFR: 122 mL/min/{1.73_m2} (ref >59)

## 2023-09-04 LAB — THYROID PANEL WITH TSH
Free Thyroxine Index: 2 (ref 1.2–4.9)
T3 Uptake Ratio: 26 % (ref 24–39)
T4, Total: 7.6 ug/dL (ref 4.5–12.0)
TSH: 2.55 u[IU]/mL (ref 0.450–4.500)

## 2023-09-04 LAB — HEMOGLOBIN A1C
Est. average glucose Bld gHb Est-mCnc: 114 mg/dL
Hgb A1c MFr Bld: 5.6 % (ref 4.8–5.6)

## 2023-09-04 LAB — PROLACTIN: Prolactin: 11.1 ng/mL (ref 4.8–33.4)

## 2023-09-04 NOTE — Progress Notes (Signed)
BMP wnl, A1c wnl, Prolactin wnl on fasting bloodwork, TSH wnl -- rec cont on iron supplement -- will discuss further imaging at f/up visit.

## 2023-09-04 NOTE — Telephone Encounter (Addendum)
-----   Message from Sundra Aland sent at 09/04/2023 10:29 AM EDT ----- BMP wnl, A1c wnl, Prolactin wnl on fasting bloodwork, TSH wnl -- rec cont on iron supplement -- will discuss further imaging at f/up visit.   Called pt with [facility] Interpreter # [phone] and pt advised provider's recommendation.  Pt verbalized understanding.  Per chart review pt was to have an U/S ordered but was not scheduled.  U/S scheduled on 09/12/23 at 1030.  Pt agreed with no further questions.   [name]  09/04/23

## 2023-09-05 ENCOUNTER — Other Ambulatory Visit: Payer: Self-pay

## 2023-09-05 ENCOUNTER — Ambulatory Visit (INDEPENDENT_AMBULATORY_CARE_PROVIDER_SITE_OTHER): Payer: Self-pay | Admitting: Student

## 2023-09-05 DIAGNOSIS — R519 Headache, unspecified: Secondary | ICD-10-CM

## 2023-09-05 DIAGNOSIS — D509 Iron deficiency anemia, unspecified: Secondary | ICD-10-CM

## 2023-09-05 MED ORDER — FERROUS SULFATE 324 MG PO TBEC
324.0000 mg | DELAYED_RELEASE_TABLET | ORAL | 3 refills | Status: DC
Start: 2023-09-05 — End: 2023-09-18
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
## 2023-09-12 ENCOUNTER — Ambulatory Visit (HOSPITAL_COMMUNITY): Payer: Self-pay | Attending: Obstetrics and Gynecology

## 2023-09-16 ENCOUNTER — Other Ambulatory Visit: Payer: Self-pay | Admitting: Family Medicine

## 2023-09-17 ENCOUNTER — Telehealth: Payer: Self-pay | Admitting: Family Medicine

## 2023-09-17 DIAGNOSIS — D509 Iron deficiency anemia, unspecified: Secondary | ICD-10-CM

## 2023-09-17 DIAGNOSIS — N939 Abnormal uterine and vaginal bleeding, unspecified: Secondary | ICD-10-CM

## 2023-09-17 NOTE — Telephone Encounter (Signed)
Patient came in stating that she was unable to pick up her Lysteda and Iron from the pharmacy. She just found out that her medicaid is no longer active and can not afford to pay out of pocket for these medications. She is asking if there is anything we can do to help with this.

## 2023-09-18 ENCOUNTER — Encounter: Payer: Self-pay | Admitting: Family Medicine

## 2023-09-18 ENCOUNTER — Other Ambulatory Visit (HOSPITAL_COMMUNITY): Payer: Self-pay

## 2023-09-18 MED ORDER — FERROUS SULFATE 324 MG PO TBEC
324.0000 mg | DELAYED_RELEASE_TABLET | ORAL | 3 refills | Status: AC
Start: 2023-09-18 — End: ?
  Filled 2023-09-18: qty 100, 200d supply, fill #0

## 2023-09-18 MED ORDER — TRANEXAMIC ACID 650 MG PO TABS
1300.0000 mg | ORAL_TABLET | Freq: Three times a day (TID) | ORAL | 2 refills | Status: DC
Start: 2023-09-18 — End: 2024-08-03
  Filled 2023-09-18: qty 30, 5d supply, fill #0

## 2023-09-18 NOTE — Telephone Encounter (Signed)
I sent both medications to [facility] outpatient pharmacy.  Will see if they are covered there.   Nursing please call patient to let her know the medications are at [facility] outpatient.  When you talk with her please also schedule her visit with any provider in the coming weeks so they can review her medications and also help her navigate the Medicaid process.  She can go to the fourth floor at the [facility] to apply for Medicaid again.  Please let her know this.  The patient uses a Swahili interpreter

## 2023-09-19 ENCOUNTER — Other Ambulatory Visit (HOSPITAL_COMMUNITY): Payer: Self-pay

## 2023-09-21 IMAGING — US US PELVIS COMPLETE WITH TRANSVAGINAL
1 series · 15 of 25 positions shown · non-contrast
Comparison: Pelvic ultrasound 01/27/2021

CLINICAL DATA: Amenorrhea

EXAM:
TRANSABDOMINAL AND TRANSVAGINAL ULTRASOUND OF PELVIS
TECHNIQUE: Both transabdominal and transvaginal ultrasound examinations of the
pelvis were performed. Transabdominal technique was performed for
global imaging of the pelvis including uterus, ovaries, adnexal
regions, and pelvic cul-de-sac. It was necessary to proceed with
endovaginal exam following the transabdominal exam to visualize the
adnexa and endometrium.

[Series 1: us pelvis complete mc & wl · 15 of 79 slices shown]
[im 1/79]
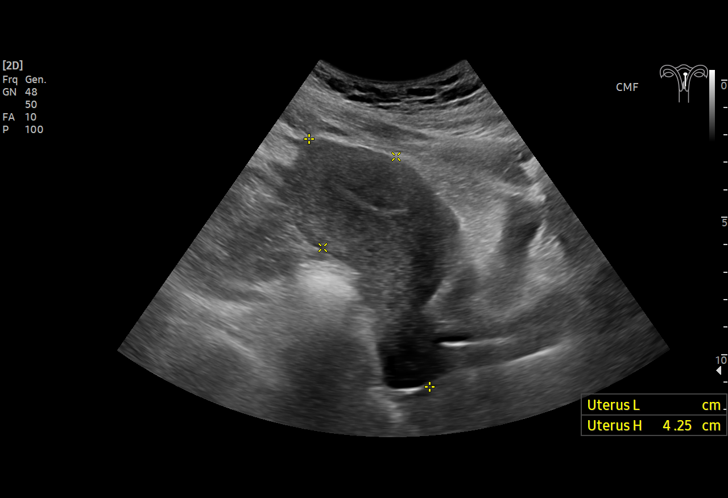
[im 7/79]
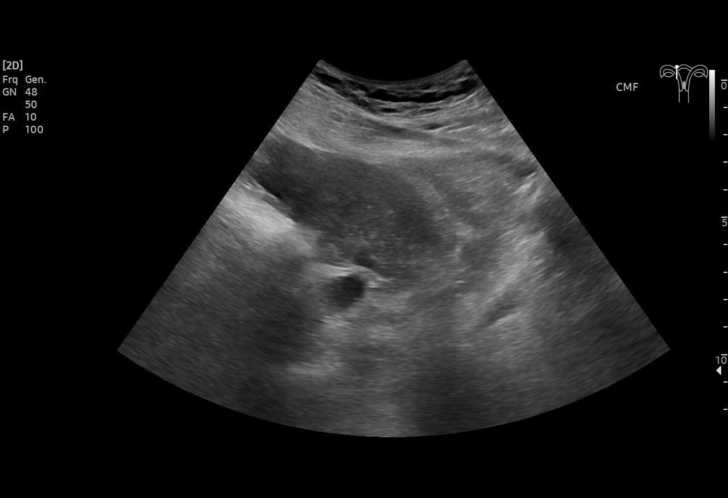
[im 14/79]
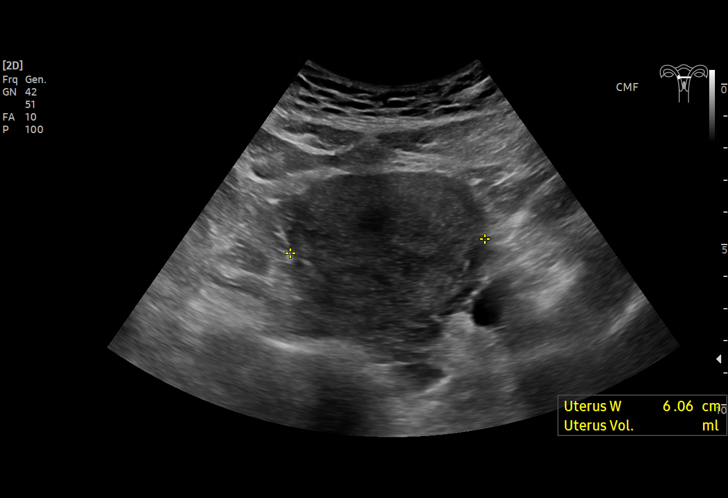
[im 17/79]
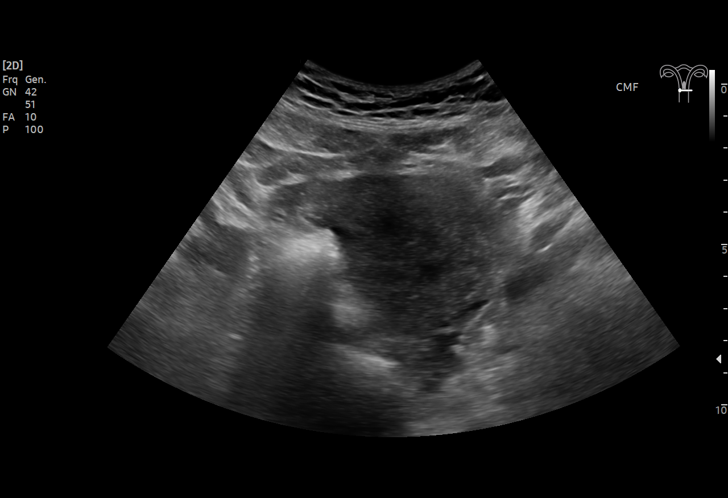
[im 23/79]
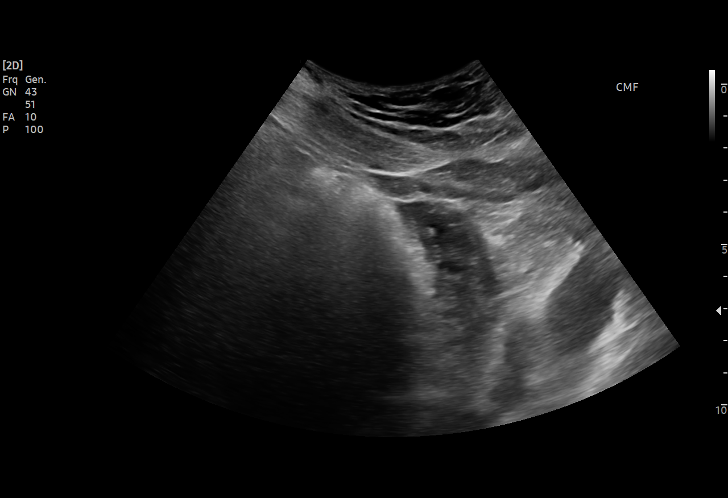
[im 30/79]
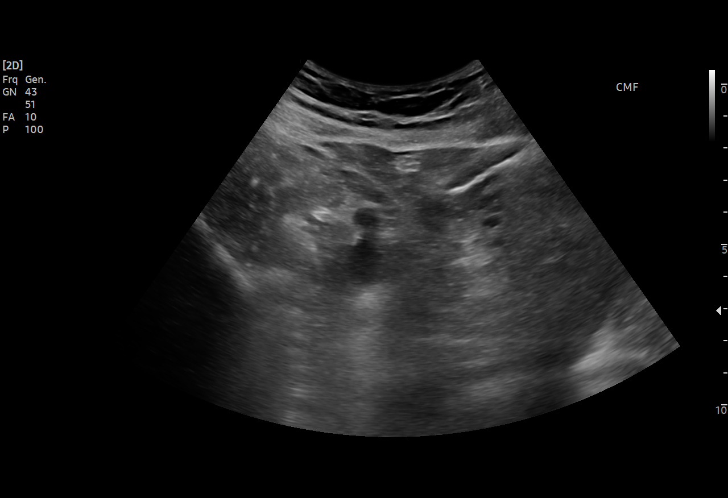
[im 33/79]
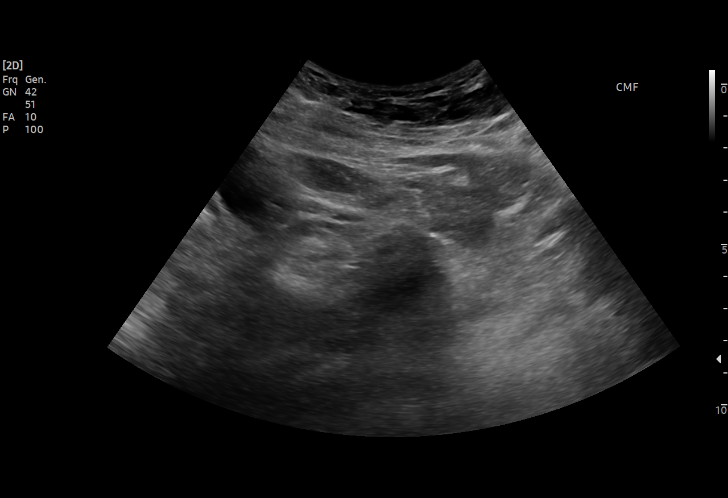
[im 40/79]
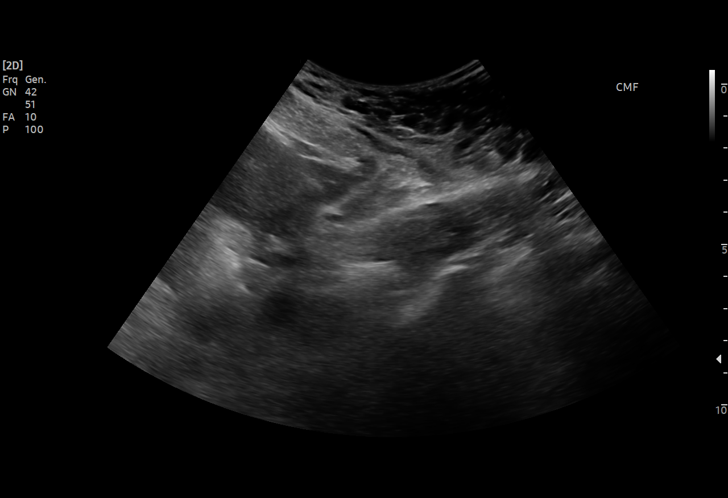
[im 46/79]
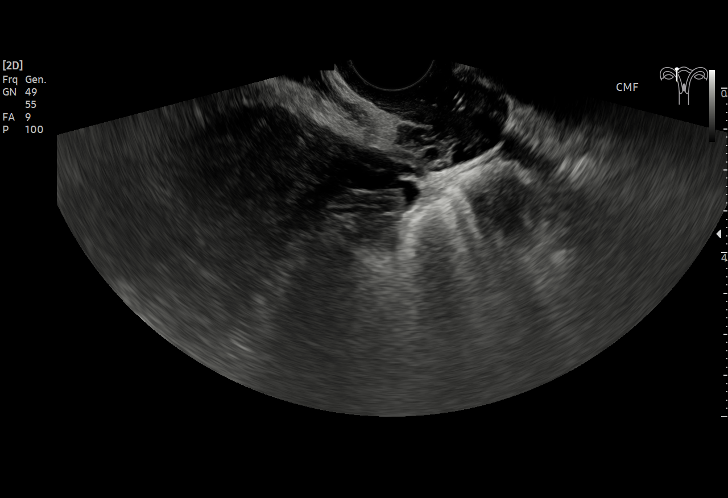
[im 49/79]
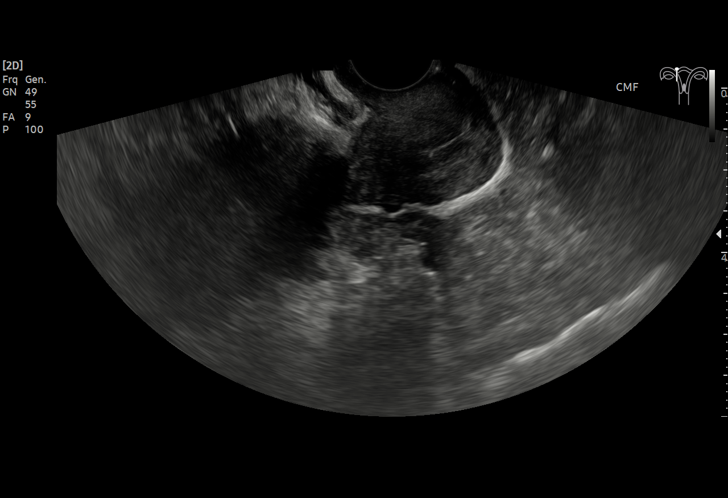
[im 56/79]
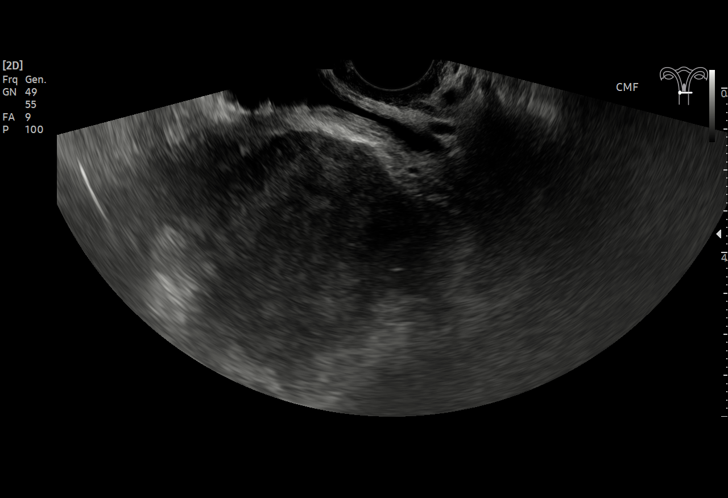
[im 62/79]
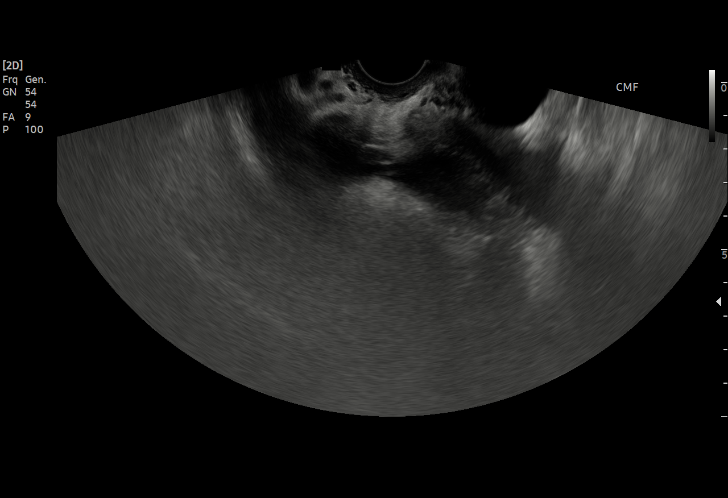
[im 66/79]
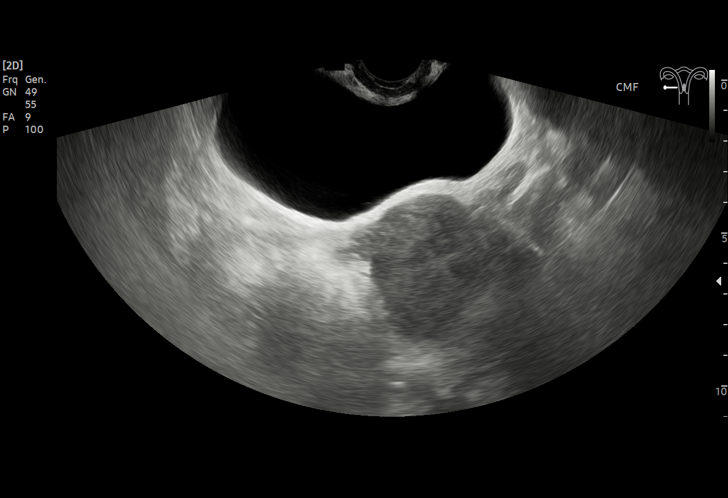
[im 72/79]
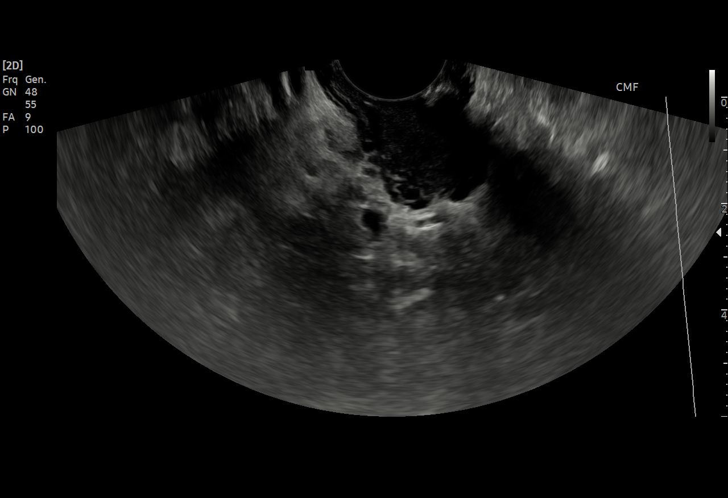
[im 79/79]
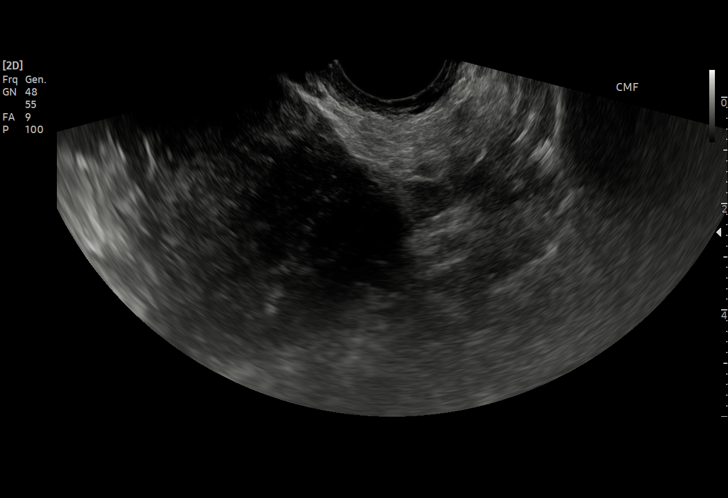

[15 of 25 positions shown; findings below may reference images not displayed]

FINDINGS: Uterus

Measurements: 10.4 x 5.2 x 4.8 cm = volume: 136 mL. No fibroids or
other mass visualized.

Endometrium

Thickness: 7 mm.  No focal abnormality visualized.

Right ovary

Not visualized.

Left ovary

There is a 4.5 x 3.2 x 4 cm anechoic cystic structure in the left
adnexa with no Doppler flow.

Other findings

No abnormal free fluid.
IMPRESSION: 1. Normal appearance of the uterus.
2. Ovaries not visualized.
3. 4.5 x 4 cm left adnexal cyst, consider follow-up ultrasound in
2-3 months.

## 2023-09-24 ENCOUNTER — Encounter: Payer: Self-pay | Admitting: Family Medicine

## 2023-09-24 ENCOUNTER — Telehealth: Payer: Self-pay | Admitting: Pharmacy Technician

## 2023-09-24 NOTE — Telephone Encounter (Signed)
Dr. Lucianne Muss, [place] Childes note:  Patient will be scheduled as soon as possible.  Auth Submission: NO AUTH NEEDED Site of care: Site of care: [facility] Payer: [employer] medicaid Medication & CPT/J Code(s) submitted: Venofer (Iron Sucrose) J1756 Route of submission (phone, fax, portal):  Phone # Fax # Auth type: Buy/Bill PB Units/visits requested: 5 Reference number:  Approval from: 09/24/23 to 11/23/23

## 2023-09-26 ENCOUNTER — Ambulatory Visit (HOSPITAL_COMMUNITY)
Admission: RE | Admit: 2023-09-26 | Discharge: 2023-09-26 | Disposition: A | Discharge status: Home / Self Care | Payer: Medicaid Other | Source: Ambulatory Visit | Attending: Obstetrics and Gynecology | Admitting: Obstetrics and Gynecology

## 2023-09-26 ENCOUNTER — Other Ambulatory Visit: Payer: Self-pay | Admitting: Obstetrics and Gynecology

## 2023-09-26 DIAGNOSIS — Z3169 Encounter for other general counseling and advice on procreation: Secondary | ICD-10-CM

## 2023-09-26 DIAGNOSIS — N939 Abnormal uterine and vaginal bleeding, unspecified: Secondary | ICD-10-CM | POA: Diagnosis present

## 2023-09-27 ENCOUNTER — Ambulatory Visit: Payer: Medicaid Other

## 2023-09-27 ENCOUNTER — Other Ambulatory Visit: Payer: Self-pay | Admitting: Obstetrics and Gynecology

## 2023-09-27 ENCOUNTER — Telehealth: Payer: Self-pay | Admitting: Family Medicine

## 2023-09-27 VITALS — BP 132/84 | HR 69 | Temp 98.4°F | Resp 16 | Ht 68.0 in | Wt 245.6 lb

## 2023-09-27 DIAGNOSIS — R222 Localized swelling, mass and lump, trunk: Secondary | ICD-10-CM

## 2023-09-27 DIAGNOSIS — D500 Iron deficiency anemia secondary to blood loss (chronic): Secondary | ICD-10-CM

## 2023-09-27 DIAGNOSIS — N939 Abnormal uterine and vaginal bleeding, unspecified: Secondary | ICD-10-CM | POA: Diagnosis not present

## 2023-09-27 MED ORDER — DIPHENHYDRAMINE HCL 25 MG PO CAPS
25.0000 mg | ORAL_CAPSULE | Freq: Once | ORAL | Status: AC
  Administered 2023-09-27: 25 mg via ORAL
  Filled 2023-09-27: qty 1

## 2023-09-27 MED ORDER — ACETAMINOPHEN 325 MG PO TABS
650.0000 mg | ORAL_TABLET | Freq: Once | ORAL | Status: AC
  Administered 2023-09-27: 650 mg via ORAL
  Filled 2023-09-27: qty 2

## 2023-09-27 MED ORDER — SODIUM CHLORIDE 0.9 % IV SOLN
200.0000 mg | Freq: Once | INTRAVENOUS | Status: AC
  Administered 2023-09-27: 200 mg via INTRAVENOUS
  Filled 2023-09-27: qty 10
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
## 2023-09-27 NOTE — Patient Instructions (Signed)
Iron Sucrose Injection What is this medication? IRON SUCROSE (EYE ern SOO krose) treats low levels of iron (iron deficiency anemia) in people with kidney disease. Iron is a mineral that plays an important role in making red blood cells, which carry oxygen from your lungs to the rest of your body. This medicine may be used for other purposes; ask your health care provider or pharmacist if you have questions. COMMON BRAND NAME(S): Venofer What should I tell my care team before I take this medication? They need to know if you have any of these conditions: Anemia not caused by low iron levels Heart disease High levels of iron in the blood Kidney disease Liver disease An unusual or allergic reaction to iron, other medications, foods, dyes, or preservatives Pregnant or trying to get pregnant Breastfeeding How should I use this medication? This medication is for infusion into a vein. It is given in a hospital or clinic setting. Talk to your care team about the use of this medication in children. While this medication may be prescribed for children as young as 2 years for selected conditions, precautions do apply. Overdosage: If you think you have taken too much of this medicine contact a poison control center or emergency room at once. NOTE: This medicine is only for you. Do not share this medicine with others. What if I miss a dose? Keep appointments for follow-up doses. It is important not to miss your dose. Call your care team if you are unable to keep an appointment. What may interact with this medication? Do not take this medication with any of the following: Deferoxamine Dimercaprol Other iron products This medication may also interact with the following: Chloramphenicol Deferasirox This list may not describe all possible interactions. Give your health care provider a list of all the medicines, herbs, non-prescription drugs, or dietary supplements you use. Also tell them if you smoke,  drink alcohol, or use illegal drugs. Some items may interact with your medicine. What should I watch for while using this medication? Visit your care team regularly. Tell your care team if your symptoms do not start to get better or if they get worse. You may need blood work done while you are taking this medication. You may need to follow a special diet. Talk to your care team. Foods that contain iron include: whole grains/cereals, dried fruits, beans, or peas, leafy green vegetables, and organ meats (liver, kidney). What side effects may I notice from receiving this medication? Side effects that you should report to your care team as soon as possible: Allergic reactions--skin rash, itching, hives, swelling of the face, lips, tongue, or throat Low blood pressure--dizziness, feeling faint or lightheaded, blurry vision Shortness of breath Side effects that usually do not require medical attention (report to your care team if they continue or are bothersome): Flushing Headache Joint pain Muscle pain Nausea Pain, redness, or irritation at injection site This list may not describe all possible side effects. Call your doctor for medical advice about side effects. You may report side effects to FDA at 1-800-FDA-1088. Where should I keep my medication? This medication is given in a hospital or clinic. It will not be stored at home. NOTE: This sheet is a summary. It may not cover all possible information. If you have questions about this medicine, talk to your doctor, pharmacist, or health care provider.  2024 Elsevier/Gold Standard (2023-05-17 00:00:00)

## 2023-09-27 NOTE — Progress Notes (Signed)
Diagnosis: Acute Anemia  Provider:  Chilton Greathouse MD  Procedure: IV Infusion  IV Type: Peripheral, IV Location: R Forearm  Venofer (Iron Sucrose), Dose: 200 mg  Infusion Start Time: 1205  Infusion Stop Time: 1225  Post Infusion IV Care: Observation period completed and Peripheral IV Discontinued  Discharge: Condition: Good, Destination: Home . AVS Provided  Performed by:  Nat Math, RN

## 2023-09-30 ENCOUNTER — Telehealth: Payer: Self-pay

## 2023-09-30 ENCOUNTER — Ambulatory Visit: Payer: Medicaid Other

## 2023-09-30 DIAGNOSIS — R222 Localized swelling, mass and lump, trunk: Secondary | ICD-10-CM

## 2023-09-30 MED ORDER — DIPHENHYDRAMINE HCL 25 MG PO CAPS: 25.0000 mg | ORAL_CAPSULE | Freq: Once | ORAL | Status: DC

## 2023-09-30 MED ORDER — ACETAMINOPHEN 325 MG PO TABS: 650.0000 mg | ORAL_TABLET | Freq: Once | ORAL | Status: DC

## 2023-09-30 MED ORDER — SODIUM CHLORIDE 0.9 % IV SOLN
200.0000 mg | Freq: Once | INTRAVENOUS | Status: DC
  Filled 2023-09-30: qty 10

## 2023-09-30 NOTE — Telephone Encounter (Signed)
-----   Message from Lorriane Shire sent at 09/27/2023  3:33 PM EDT ----- Regarding: testing Hello,  Please contact and review that mass/collection noted in abdominal wall during ultrasound. Recommend CT scan to better assess.  Best,  Tilden Dome pt with [facility] interpreter ID [phone]. Per chart review no clear plan for lab visit today and patient scheduled to see provider tomorrow. Reviewed lab appointment today has been cancelled and patient should follow up at visit tomorrow at 10:35. [place] results reviewed and offered to schedule CT now or patient may talk with provider tomorrow. Pt would like to schedule. CT scheduled for 10/11/23.

## 2023-10-01 ENCOUNTER — Other Ambulatory Visit: Payer: Medicaid Other

## 2023-10-01 ENCOUNTER — Other Ambulatory Visit (HOSPITAL_COMMUNITY): Payer: Self-pay

## 2023-10-01 ENCOUNTER — Other Ambulatory Visit: Payer: Self-pay

## 2023-10-02 ENCOUNTER — Ambulatory Visit: Payer: Self-pay | Admitting: Family Medicine

## 2023-10-02 ENCOUNTER — Ambulatory Visit: Payer: Medicaid Other

## 2023-10-02 VITALS — BP 108/70 | HR 65 | Temp 98.0°F | Resp 18 | Ht 68.0 in | Wt 246.2 lb

## 2023-10-02 DIAGNOSIS — N939 Abnormal uterine and vaginal bleeding, unspecified: Secondary | ICD-10-CM | POA: Diagnosis not present

## 2023-10-02 DIAGNOSIS — D500 Iron deficiency anemia secondary to blood loss (chronic): Secondary | ICD-10-CM

## 2023-10-02 MED ORDER — ACETAMINOPHEN 325 MG PO TABS
650.0000 mg | ORAL_TABLET | Freq: Once | ORAL | Status: AC
  Administered 2023-10-02: 650 mg via ORAL
  Filled 2023-10-02: qty 2

## 2023-10-02 MED ORDER — SODIUM CHLORIDE 0.9 % IV SOLN
200.0000 mg | Freq: Once | INTRAVENOUS | Status: AC
  Administered 2023-10-02: 200 mg via INTRAVENOUS
  Filled 2023-10-02: qty 10

## 2023-10-02 MED ORDER — DIPHENHYDRAMINE HCL 25 MG PO CAPS
25.0000 mg | ORAL_CAPSULE | Freq: Once | ORAL | Status: AC
  Administered 2023-10-02: 25 mg via ORAL
  Filled 2023-10-02: qty 1

## 2023-10-02 NOTE — Progress Notes (Signed)
Diagnosis: Iron Deficiency Anemia  Provider:  Chilton Greathouse MD  Procedure: IV Infusion  IV Type: Peripheral, IV Location: L Antecubital  Venofer (Iron Sucrose), Dose: 200 mg  Infusion Start Time: 1534  Infusion Stop Time: 1553  Post Infusion IV Care: Observation period completed and Peripheral IV Discontinued  Discharge: Condition: Good, Destination: Home . AVS Provided  Performed by:  Rico Ala, LPN

## 2023-10-04 ENCOUNTER — Ambulatory Visit: Payer: Medicaid Other

## 2023-10-04 VITALS — BP 113/67 | HR 62 | Temp 98.4°F | Resp 16 | Ht 68.0 in | Wt 244.0 lb

## 2023-10-04 DIAGNOSIS — D500 Iron deficiency anemia secondary to blood loss (chronic): Secondary | ICD-10-CM

## 2023-10-04 DIAGNOSIS — N939 Abnormal uterine and vaginal bleeding, unspecified: Secondary | ICD-10-CM | POA: Diagnosis not present

## 2023-10-04 MED ORDER — DIPHENHYDRAMINE HCL 25 MG PO CAPS
25.0000 mg | ORAL_CAPSULE | Freq: Once | ORAL | Status: AC
  Administered 2023-10-04: 25 mg via ORAL

## 2023-10-04 MED ORDER — SODIUM CHLORIDE 0.9 % IV SOLN
200.0000 mg | Freq: Once | INTRAVENOUS | Status: AC
  Administered 2023-10-04: 200 mg via INTRAVENOUS
  Filled 2023-10-04: qty 10

## 2023-10-04 MED ORDER — ACETAMINOPHEN 325 MG PO TABS
650.0000 mg | ORAL_TABLET | Freq: Once | ORAL | Status: AC
  Administered 2023-10-04: 650 mg via ORAL

## 2023-10-04 NOTE — Progress Notes (Signed)
Diagnosis: Iron Deficiency Anemia  Provider:  Chilton Greathouse MD  Procedure: IV Infusion  IV Type: Peripheral, IV Location: L Antecubital  Venofer (Iron Sucrose), Dose: 200 mg  Infusion Start Time: 1346  Infusion Stop Time: 1401  Post Infusion IV Care: Observation period completed and Peripheral IV Discontinued  Discharge: Condition: Good, Destination: Home . AVS Declined  Performed by:  Rico Ala, LPN

## 2023-10-07 ENCOUNTER — Ambulatory Visit: Payer: Medicaid Other

## 2023-10-07 VITALS — BP 104/67 | HR 62 | Temp 97.4°F | Resp 18 | Ht 68.0 in | Wt 247.6 lb

## 2023-10-07 DIAGNOSIS — D500 Iron deficiency anemia secondary to blood loss (chronic): Secondary | ICD-10-CM

## 2023-10-07 DIAGNOSIS — N939 Abnormal uterine and vaginal bleeding, unspecified: Secondary | ICD-10-CM | POA: Diagnosis not present

## 2023-10-07 MED ORDER — ACETAMINOPHEN 325 MG PO TABS
650.0000 mg | ORAL_TABLET | Freq: Once | ORAL | Status: AC
  Administered 2023-10-07: 650 mg via ORAL
  Filled 2023-10-07: qty 2

## 2023-10-07 MED ORDER — DIPHENHYDRAMINE HCL 25 MG PO CAPS
25.0000 mg | ORAL_CAPSULE | Freq: Once | ORAL | Status: AC
  Administered 2023-10-07: 25 mg via ORAL
  Filled 2023-10-07: qty 1

## 2023-10-07 MED ORDER — SODIUM CHLORIDE 0.9 % IV SOLN
200.0000 mg | Freq: Once | INTRAVENOUS | Status: AC
  Administered 2023-10-07: 200 mg via INTRAVENOUS
  Filled 2023-10-07: qty 10

## 2023-10-07 NOTE — Progress Notes (Signed)
Diagnosis: Iron Deficiency Anemia  Provider:  Chilton Greathouse MD  Procedure: IV Infusion  IV Type: Peripheral, IV Location: L Antecubital  Venofer (Iron Sucrose), Dose: 200 mg  Infusion Start Time: 1510  Infusion Stop Time: 1538  Post Infusion IV Care: Observation period completed and Peripheral IV Discontinued  Discharge: Condition: Good, Destination: Home . AVS Declined  Performed by:  Wyvonne Lenz, RN

## 2023-10-10 ENCOUNTER — Encounter (HOSPITAL_COMMUNITY): Payer: Self-pay

## 2023-10-10 ENCOUNTER — Ambulatory Visit (HOSPITAL_COMMUNITY)
Admission: EM | Admit: 2023-10-10 | Discharge: 2023-10-10 | Disposition: A | Discharge status: Home / Self Care | Payer: Medicaid Other | Attending: Internal Medicine | Admitting: Internal Medicine

## 2023-10-10 DIAGNOSIS — R1032 Left lower quadrant pain: Secondary | ICD-10-CM | POA: Diagnosis not present

## 2023-10-10 DIAGNOSIS — R0789 Other chest pain: Secondary | ICD-10-CM

## 2023-10-10 MED ORDER — LIDOCAINE VISCOUS HCL 2 % MT SOLN
OROMUCOSAL | Status: AC
  Filled 2023-10-10: qty 15

## 2023-10-10 MED ORDER — PANTOPRAZOLE SODIUM 20 MG PO TBEC: 20.0000 mg | DELAYED_RELEASE_TABLET | Freq: Every day | ORAL | 0 refills | Status: DC

## 2023-10-10 MED ORDER — ALUM & MAG HYDROXIDE-SIMETH 200-200-20 MG/5ML PO SUSP
30.0000 mL | Freq: Once | ORAL | Status: AC
  Administered 2023-10-10: 30 mL via ORAL

## 2023-10-10 MED ORDER — LIDOCAINE VISCOUS HCL 2 % MT SOLN
15.0000 mL | Freq: Once | OROMUCOSAL | Status: AC
  Administered 2023-10-10: 15 mL via OROMUCOSAL

## 2023-10-10 MED ORDER — NAPROXEN 375 MG PO TABS: 375.0000 mg | ORAL_TABLET | Freq: Two times a day (BID) | ORAL | 0 refills | Status: DC

## 2023-10-10 MED ORDER — ALUM & MAG HYDROXIDE-SIMETH 200-200-20 MG/5ML PO SUSP
ORAL | Status: AC
  Filled 2023-10-10: qty 30

## 2023-10-10 NOTE — Discharge Instructions (Signed)
Take the naproxen twice daily to help with your pain.  This should help with your chest pain and your abdominal pain.  You can take the Protonix once daily to help if your symptoms are related to acid reflux.  Please eat small frequent meals and stay upright afterwards.  Avoid fried or fatty foods.  Please call your insurance regarding the cancellation of your ultrasound to ensure you are still covered.  Return to clinic for any new or urgent symptoms.
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
## 2023-10-10 NOTE — ED Triage Notes (Addendum)
Presents with localized mid chest pain that started last night around 6pm. Pt states was on and off last night and now it wont go away.   Pt denies headaches and c/o dizziness. X/o lt lower abd pain. Denies n/v/d.

## 2023-10-11 ENCOUNTER — Ambulatory Visit (INDEPENDENT_AMBULATORY_CARE_PROVIDER_SITE_OTHER): Payer: Medicaid Other | Admitting: *Deleted

## 2023-10-11 ENCOUNTER — Telehealth: Payer: Self-pay

## 2023-10-11 ENCOUNTER — Ambulatory Visit (HOSPITAL_COMMUNITY): Payer: Medicaid Other

## 2023-10-11 VITALS — BP 129/70 | HR 74 | Temp 98.3°F | Resp 16 | Ht 68.0 in | Wt 247.0 lb

## 2023-10-11 DIAGNOSIS — D500 Iron deficiency anemia secondary to blood loss (chronic): Secondary | ICD-10-CM

## 2023-10-11 DIAGNOSIS — N939 Abnormal uterine and vaginal bleeding, unspecified: Secondary | ICD-10-CM

## 2023-10-11 MED ORDER — SODIUM CHLORIDE 0.9 % IV SOLN
200.0000 mg | Freq: Once | INTRAVENOUS | Status: AC
  Administered 2023-10-11: 200 mg via INTRAVENOUS
  Filled 2023-10-11: qty 10

## 2023-10-11 MED ORDER — DIPHENHYDRAMINE HCL 25 MG PO CAPS: 25.0000 mg | ORAL_CAPSULE | Freq: Once | ORAL | Status: DC

## 2023-10-11 MED ORDER — ACETAMINOPHEN 325 MG PO TABS: 650.0000 mg | ORAL_TABLET | Freq: Once | ORAL | Status: DC

## 2023-10-11 NOTE — Progress Notes (Signed)
Diagnosis: Iron Deficiency Anemia  Provider:  Chilton Greathouse MD  Procedure: IV Infusion  IV Type: Peripheral, IV Location: L Antecubital  Venofer (Iron Sucrose), Dose: 200 mg  Infusion Start Time: 1212  pm  Infusion Stop Time: 1230 pm  Post Infusion IV Care: Observation period completed and Peripheral IV Discontinued  Discharge: Condition: Good, Destination: Home . AVS Provided  Performed by:  Forrest Moron, RN

## 2023-10-11 NOTE — Telephone Encounter (Signed)
error 

## 2023-10-14 ENCOUNTER — Telehealth: Payer: Self-pay | Admitting: Obstetrics and Gynecology

## 2023-10-14 NOTE — Plan of Care (Signed)
[facility] Tonsillectomy/Adenoidectomy, Postoperative PEDS care plan entered in error.

## 2023-10-14 NOTE — Telephone Encounter (Signed)
Called patient regarding CT scan and reviewed indication. Patient states there is an area next to her umbilicus that feels like she hit it against something or there is a cut without seeing anything on the kin. Advised that the US demonstrates a mass in her abdominal wall and CT scan is next step to better understand what it is. She agrees to having it re-scheduled.   Patient also asked about whether her iron transfusions have been working. Noted that response to iron transfusion is based on CBC.   Patient has visit scheduled 10/23. Will address AUB and infertility at that time.   Interpreter ID #: [phone] [phone] [id number]  Total time: 14 minutes

## 2023-10-16 ENCOUNTER — Encounter: Payer: Self-pay | Admitting: Obstetrics and Gynecology

## 2023-10-16 ENCOUNTER — Ambulatory Visit: Payer: Medicaid Other | Admitting: Obstetrics and Gynecology

## 2023-10-16 ENCOUNTER — Other Ambulatory Visit: Payer: Self-pay

## 2023-10-16 VITALS — BP 117/62 | HR 68 | Wt 242.5 lb

## 2023-10-16 DIAGNOSIS — N979 Female infertility, unspecified: Secondary | ICD-10-CM

## 2023-10-16 DIAGNOSIS — R222 Localized swelling, mass and lump, trunk: Secondary | ICD-10-CM | POA: Diagnosis not present

## 2023-10-16 DIAGNOSIS — N939 Abnormal uterine and vaginal bleeding, unspecified: Secondary | ICD-10-CM | POA: Diagnosis not present

## 2023-10-16 MED ORDER — LETROZOLE 2.5 MG PO TABS
2.5000 mg | ORAL_TABLET | Freq: Every day | ORAL | 2 refills | Status: DC
Start: 2023-10-16 — End: 2024-08-03

## 2023-10-16 NOTE — Progress Notes (Signed)
GYNECOLOGY VISIT  Patient name: Kathy Richards MRN [id number]  Date of birth: September 06, 1991 Chief Complaint:   Follow-up  History:  Kathy Richards is a 32 y.o. G3P2011 being seen today for follow up. Continues to feel pain in abdomen close to umbilicus. She is wondering if her blood count is better since she has been getting the iron. She is still interested in getting pregnant. Has not been using OPKs to assess if she is ovulating.  She has renewed her insurance and it should now be active.    Past Medical History:  Diagnosis Date   GERD (gastroesophageal reflux disease)    Microcytic anemia 11/21/2020   Obesity     Past Surgical History:  Procedure Laterality Date   CESAREAN SECTION     x2    The following portions of the patient's history were reviewed and updated as appropriate: allergies, current medications, past family history, past medical history, past social history, past surgical history and problem list.   Health Maintenance:   Last pap     Component Value Date/Time   DIAGPAP  11/21/2020 1101    - Negative for intraepithelial lesion or malignancy (NILM)   ADEQPAP  11/21/2020 1101    Satisfactory for evaluation; transformation zone component ABSENT.    High Risk HPV: Positive  Adequacy:  Satisfactory for evaluation, transformation zone component PRESENT  Diagnosis:  Atypical squamous cells of undetermined significance (ASC-US)  Last mammogram: n/a   Review of Systems:  Pertinent items are noted in HPI. Comprehensive review of systems was otherwise negative.   Objective:  Physical Exam BP 117/62   Pulse 68   Wt 242 lb 8 oz (110 kg)   LMP 10/06/2023 (Exact Date)   BMI 36.87 kg/m    Physical Exam Vitals and nursing note reviewed.  Constitutional:      Appearance: Normal appearance.  HENT:     Head: Normocephalic and atraumatic.  Pulmonary:     Effort: Pulmonary effort is normal.  Abdominal:     Comments: Left periumbilical tenderness   Skin:    General: Skin is warm and dry.  Neurological:     General: No focal deficit present.     Mental Status: She is alert.  Psychiatric:        Mood and Affect: Mood normal.        Behavior: Behavior normal.        Thought Content: Thought content normal.        Judgment: Judgment normal.      Labs and Imaging US PELVIS (TRANSABDOMINAL ONLY)  Result Date: 09/27/2023 CLINICAL DATA:  abnormal uterine bleeding EXAM: ULTRASOUND OF PELVIS TECHNIQUE: Transabdominal and transvaginalultrasound examination of the pelvis was performed including evaluation of the uterus, ovaries, adnexal regions, and pelvic cul-de-sac. COMPARISON:  06/28/2022. FINDINGS: Uterusanteverted, 9 x 7 x 5 cm. The endometrium unremarkable, 1.2 cm. The uterine cavity is empty. The following uterine fibroids were identified. 1. Anterior intramural, 1.6 x 1.3 x 0.9 cm. 2. Anterior subserosal, 1.2 x 0.9 x 0.8 cm. Right ovary Unremarkable, 4.6 x 2.5 x 2.2 cm. Left ovary Unremarkable, 2.5 x 2.2 x 1.3 cm. Images of the adnexae demonstrated no masses or fluid collections. Ovaries demonstrate blood flow with color Doppler. The patient described an area of pain in the periumbilical region, and images were obtained of the abdominal wall in this location. There is a suspicious appearing hypoechoic solid mass with irregular margins measuring 3.1 x 2.5 x 1.6 cm. A neoplastic process cannot  be excluded. Consider contrast-enhanced CT for further evaluation. IMPRESSION: 1. Small uterine fibroids, described above. 2. Incidentally identified abdominal wall suspicious 3.1 cm mass. Additional evaluation recommended with contrast-enhanced CT. Electronically Signed   By: Layla Maw M.D.   On: 09/27/2023 13:47       Assessment & Plan:   1. Abnormal uterine bleeding (AUB) CBC today to assess response to iron transfusions. Continue TXA for menstrual management.  - CBC  2. Infertility, female Discussed use of letrozole for ovulation  induction. Normal [place] on file. Reviewed that it is preferred she have CT scan completed soon as it is a test with contrast which is avoided in pregnancy. Will return on day 21 for progesterone testing to assess ovulation.  - letrozole (FEMARA) 2.5 MG tablet; Take 1 tablet (2.5 mg total) by mouth daily. Take on days 3 to 7 following a spontaneous menses or progestin-induced bleed.  Dispense: 5 tablet; Refill: 2 - Progesterone; Future  3. Abdominal wall mass CT scan to be re-scheduled to assess mass and pain abdominal wall.   Routine preventative health maintenance measures emphasized.  Lorriane Shire, MD [facility], [facility]

## 2023-10-16 NOTE — Patient Instructions (Addendum)
Schedule lab visit for day 21 of next cycle - please call us on the first day of your next period

## 2023-10-17 ENCOUNTER — Other Ambulatory Visit: Payer: Medicaid Other

## 2023-10-17 ENCOUNTER — Telehealth: Payer: Self-pay | Admitting: Family Medicine

## 2023-10-17 LAB — CBC
Hematocrit: 35.1 % (ref 34.0–46.6)
Hemoglobin: 9.9 g/dL — ABNORMAL LOW (ref 11.1–15.9)
MCH: 19.8 pg — ABNORMAL LOW (ref 26.6–33.0)
MCHC: 28.2 g/dL — ABNORMAL LOW (ref 31.5–35.7)
MCV: 70 fL — ABNORMAL LOW (ref 79–97)
Platelets: 302 10*3/uL (ref 150–450)
RBC: 5.01 x10E6/uL (ref 3.77–5.28)
RDW: 26.7 % — ABNORMAL HIGH (ref 11.7–15.4)
WBC: 4.8 10*3/uL (ref 3.4–10.8)

## 2023-10-17 NOTE — Telephone Encounter (Signed)
Patient has questions regarding medication that was given to her. She stated the medication is for symptoms that she does not have so she would like to speak to someone before she takes it. I told the patient that someone would give her a call.

## 2023-10-18 NOTE — Telephone Encounter (Signed)
Lorriane Shire, MD  P [facility] Notify that blood count has improved. Recommend txa to help slow down flow of period and continue iron pills ----------------------------------------------------------------------------------------- Lorriane Shire, MD  [facility] Hi, Can someone review the instructions for this medication with the patient. It's the letrozole. The first day of full flow of her period is considered day 1. On days 3-7 she will take the letrozole. On Day 21 she will come in for a blood test to see if she ovulated.  She also needs to get her CT done to see what is going on with her abdominal wall. This should get done before she tries to get pregnant. Thank you, Dr. Briscoe Deutscher ------------------------------------------------------------------------------------------  Called pt with [facility] interpreter ID [phone] x 2; message received stating customer is not available. Will call patient again when office reopens following the weekend.

## 2023-10-18 NOTE — Addendum Note (Signed)
Addended by: Maxwell Marion E on: 10/18/2023 01:15 PM   Modules accepted: Orders

## 2023-10-21 NOTE — Addendum Note (Signed)
Addended by: Harlon Ditty on: 10/21/2023 08:05 AM   Modules accepted: Orders

## 2023-10-21 NOTE — Telephone Encounter (Signed)
Called pt with [facility] interpreter ID [phone]. Pt asks that I call back in 15 minutes because she is around other people. Declines interpreter. Called pt 15 minutes later; VM left stating I am calling to discuss results and call back number given.

## 2023-10-22 NOTE — Telephone Encounter (Signed)
Pt notified how to take medication.  Pt reminded of her CT scheduled at [facility] on 10/24/23 at 1130.  Pt verbalized understanding.   [name]  10/22/23

## 2023-10-24 ENCOUNTER — Ambulatory Visit (HOSPITAL_COMMUNITY): Admission: RE | Admit: 2023-10-24 | Payer: Medicaid Other | Source: Ambulatory Visit

## 2023-11-11 ENCOUNTER — Ambulatory Visit: Payer: Medicaid Other

## 2023-11-11 VITALS — BP 108/62 | HR 80 | Ht 68.0 in | Wt 248.8 lb

## 2023-11-11 DIAGNOSIS — R1033 Periumbilical pain: Secondary | ICD-10-CM | POA: Diagnosis present

## 2023-11-11 DIAGNOSIS — R109 Unspecified abdominal pain: Secondary | ICD-10-CM

## 2023-11-11 LAB — POCT URINALYSIS DIP (MANUAL ENTRY)
Bilirubin, UA: NEGATIVE
Blood, UA: NEGATIVE
Glucose, UA: NEGATIVE mg/dL
Ketones, POC UA: NEGATIVE mg/dL
Leukocytes, UA: NEGATIVE
Nitrite, UA: NEGATIVE
Protein Ur, POC: NEGATIVE mg/dL
Spec Grav, UA: <=1.005 — AB (ref 1.010–1.025)
Urobilinogen, UA: 0.2 U/dL
pH, UA: 5.5 (ref 5.0–8.0)

## 2023-11-11 LAB — POCT URINE PREGNANCY: Preg Test, Ur: NEGATIVE

## 2023-11-11 NOTE — Progress Notes (Signed)
    SUBJECTIVE:   CHIEF COMPLAINT / HPI:   Abdominal pain   She feels cutting pain or sharp pain to the left of her umbilicus. Says that it was once a week since July but has been becoming more often and Friday (was very painful). No vimiting or constipation. Still has abnormal vaginal bleeding. Two periods a month and heavy bleeding  No weight loss or night sweats. No fevers. Has had two LTCS.  TVUS was concerning for 3 cm mass on abdominal wall that was suspicious.  No dysuria, abnormal discharge.   PERTINENT  PMH / PSH: Adnexal cyst, H pylori, IDA.   OBJECTIVE:   BP 108/62   Pulse 80   Ht 5\' 8"  (1.727 m)   Wt 248 lb 12.8 oz (112.9 kg)   SpO2 100%   BMI 37.83 kg/m   General: well appearing, in no acute distress CV: RRR, radial pulses equal and palpable, no BLE edema  Resp: Normal work of breathing on room air, CTAB Abd: Soft, tender to palpation 2 inches to the left of umbilicus, deep palpation can feel 2 nodules about 2cm each immobile, no distension or overlying changes in skin.  Neuro: Alert & Oriented x 4    ASSESSMENT/PLAN:   Assessment & Plan Periumbilical abdominal pain Concern for possible malignancy or cyst causing pain. Placement of pain is exactly where abdominal wall mass was on the TVUS. No B symptoms; however, mass has been present for about 2 months now and continues to cause pain. Unlikely abscess as there is no fever, fluctuance. Not likely a hernia based on exam.  - Stat CT abdomen to characterize mass as also recommended in TVUS impression.       Lockie Mola, MD [facility]

## 2023-11-11 NOTE — Patient Instructions (Signed)
It was wonderful to see you today.  Please bring ALL of your medications with you to every visit.   Today we talked about:  Abdominal pain - You need to have a CT (CAT scan) of your abdomen to check on the mass that is near your belly button. Your CT is at [address]. You can take ibuprofen for the pain as needed.   I have scheduled you a follow up appointment with Dr. Sherrilee Gilles on 11/26 at 9:50 am.   Thank you for choosing [facility].   Please call [phone] with any questions about today's appointment.  Lockie Mola, MD  Family Medicine

## 2023-11-12 LAB — COMPREHENSIVE METABOLIC PANEL
ALT: 11 [IU]/L (ref 0–32)
AST: 14 [IU]/L (ref 0–40)
Albumin: 4 g/dL (ref 4.0–5.0)
Alkaline Phosphatase: 127 [IU]/L — ABNORMAL HIGH (ref 44–121)
BUN/Creatinine Ratio: 12 (ref 9–23)
BUN: 9 mg/dL (ref 6–20)
Bilirubin Total: <0.2 mg/dL (ref 0.0–1.2)
CO2: 21 mmol/L (ref 20–29)
Calcium: 9.3 mg/dL (ref 8.7–10.2)
Chloride: 104 mmol/L (ref 96–106)
Creatinine, Ser: 0.77 mg/dL (ref 0.57–1.00)
Globulin, Total: 3.1 g/dL (ref 1.5–4.5)
Glucose: 88 mg/dL (ref 70–99)
Potassium: 4.1 mmol/L (ref 3.5–5.2)
Sodium: 136 mmol/L (ref 134–144)
Total Protein: 7.1 g/dL (ref 6.0–8.5)
eGFR: 107 mL/min/{1.73_m2} (ref >59)

## 2023-11-12 LAB — CBC WITH DIFFERENTIAL/PLATELET
Basophils Absolute: 0.1 10*3/uL (ref 0.0–0.2)
Basos: 1 %
EOS (ABSOLUTE): 0.1 10*3/uL (ref 0.0–0.4)
Eos: 2 %
Hematocrit: 34.1 % (ref 34.0–46.6)
Hemoglobin: 10 g/dL — ABNORMAL LOW (ref 11.1–15.9)
Immature Grans (Abs): 0 10*3/uL (ref 0.0–0.1)
Immature Granulocytes: 0 %
Lymphocytes Absolute: 2.1 10*3/uL (ref 0.7–3.1)
Lymphs: 41 %
MCH: 21.4 pg — ABNORMAL LOW (ref 26.6–33.0)
MCHC: 29.3 g/dL — ABNORMAL LOW (ref 31.5–35.7)
MCV: 73 fL — ABNORMAL LOW (ref 79–97)
Monocytes Absolute: 0.3 10*3/uL (ref 0.1–0.9)
Monocytes: 7 %
Neutrophils Absolute: 2.5 10*3/uL (ref 1.4–7.0)
Neutrophils: 49 %
Platelets: 248 10*3/uL (ref 150–450)
RBC: 4.67 x10E6/uL (ref 3.77–5.28)
RDW: 24.4 % — ABNORMAL HIGH (ref 11.7–15.4)
WBC: 5.1 10*3/uL (ref 3.4–10.8)

## 2023-11-18 ENCOUNTER — Other Ambulatory Visit: Payer: Medicaid Other

## 2023-11-18 ENCOUNTER — Encounter: Payer: Self-pay | Admitting: Family Medicine

## 2023-11-18 DIAGNOSIS — N979 Female infertility, unspecified: Secondary | ICD-10-CM

## 2023-11-19 ENCOUNTER — Ambulatory Visit (INDEPENDENT_AMBULATORY_CARE_PROVIDER_SITE_OTHER): Payer: Medicaid Other | Admitting: Family Medicine

## 2023-11-19 ENCOUNTER — Encounter: Payer: Self-pay | Admitting: Family Medicine

## 2023-11-19 ENCOUNTER — Telehealth: Payer: Self-pay

## 2023-11-19 VITALS — BP 112/68 | HR 83 | Ht 68.0 in | Wt 253.8 lb

## 2023-11-19 DIAGNOSIS — R1033 Periumbilical pain: Secondary | ICD-10-CM

## 2023-11-19 DIAGNOSIS — R1905 Periumbilic swelling, mass or lump: Secondary | ICD-10-CM | POA: Diagnosis not present

## 2023-11-19 LAB — PROGESTERONE: Progesterone: 0.9 ng/mL

## 2023-11-19 NOTE — Telephone Encounter (Addendum)
-----   Message from Lorriane Shire sent at 11/19/2023  1:14 PM EST ----- Notify that lab shows that she has not recently ovulated. Can increase medication but also want to be sure that she gets CT scan completed for abdominal wall mass.    Called pt. Scheduled CT for 11/25/23. PA previously completed. Pt asks that I call back or send address because she is currently driving. Link sent for [employer]. I explained if she does not register I cannot send the address and will call her back in the morning. Pt would like to increase letrozole. Reviewed with Briscoe Deutscher, MD who states she will send updated prescription once CT results have been received.

## 2023-11-19 NOTE — Progress Notes (Signed)
    SUBJECTIVE:   CHIEF COMPLAINT / HPI:   Kathy Richards is a 32yo F w/ hx of fibroids and AUB that p/f continued abdominal pain. - Chronic periumbilical pain  -Still present. 7/10. Sometimes intermittent, but sometimes can last all day. Pain feels like something is cutting. It is located to left of umbilicus.  - Does not radiate.  - Normal appetite and stool. No blood in stool. No vomiting.  - Gets heavy periods, but does not feel like the pain is directly correlated with period.  - Denies dysuria or feversd - LMP 11/9   - Also discussed labs from last visit. Is taking iron supplement as directed.   OBJECTIVE:   BP 112/68   Pulse 83   Ht 5\' 8"  (1.727 m)   Wt 253 lb 12.8 oz (115.1 kg)   LMP 11/02/2023   SpO2 100%   BMI 38.59 kg/m   General: Alert, pleasant, non-toxic appearing woman. NAD. HEENT: NCAT. MMM. CV: RRR, no murmurs.  Resp: CTAB, no wheezing or crackles. Normal WOB on RA.  Abm: Tender to palpation infero-left-lateral to umbilicus. No rebound, rigidity, guarding.. BS present. Ext: Moves all ext spontaneously Skin: Warm, well perfused   ASSESSMENT/PLAN:   Assessment & Plan Periumbilical abdominal pain Continues to have periumbilical pain. Previously seen 11/18 for same issue. Plan was to get CTAP to further characterize periumbilical mass noted on abm U/S 10/3. Spoke with clinic staff, apparently request was sent to pt insurance and we were still awaiting a response. On chart review, it looks like her OBGYN also ordereda CTA and it ischeduled 11/25/23. This should be adequate to evaluate this mass further. Reassuringly, no red flag symptoms such as inability to PO, fevers, weight loss, peritoneal signs on exam. Furthermore, hgb at prior visit stable.  - f/u CTA results - Advised using tylenol prn for pain and heating pad - Return precautions discussed    Lincoln Brigham, MD [facility]

## 2023-11-19 NOTE — Patient Instructions (Signed)
Good to see you today - Thank you for coming in  Things we discussed today:  1) For your abdominal pain - We will reach out to your insurance to try to get your CT scan approved. Once we do, we will reach out to you to schedule it. - Please take Tylenol as needed for abdominal pain - You can also apply a heating pad, or take a warm shower/bath to help with the pain  Please seek further close attention if you: - are vomiting uncontrollably -Abdominal pain becomes unbearable -You start having blood in your stools -You are unable to have bowel movements

## 2023-11-20 NOTE — Telephone Encounter (Signed)
Called patient x 2 this AM and VM left with address for CT scan.

## 2023-11-21 DIAGNOSIS — R1033 Periumbilical pain: Secondary | ICD-10-CM | POA: Insufficient documentation

## 2023-11-21 DIAGNOSIS — R1900 Intra-abdominal and pelvic swelling, mass and lump, unspecified site: Secondary | ICD-10-CM | POA: Insufficient documentation

## 2023-11-21 NOTE — Assessment & Plan Note (Signed)
Continues to have periumbilical pain. Previously seen 11/18 for same issue. Plan was to get CTAP to further characterize periumbilical mass noted on abm U/S 10/3. Spoke with clinic staff, apparently request was sent to pt insurance and we were still awaiting a response. On chart review, it looks like her OBGYN also ordereda CTA and it ischeduled 11/25/23. This should be adequate to evaluate this mass further. Reassuringly, no red flag symptoms such as inability to PO, fevers, weight loss, peritoneal signs on exam. Furthermore, hgb at prior visit stable.  - f/u CTA results - Advised using tylenol prn for pain and heating pad - Return precautions discussed

## 2023-11-25 ENCOUNTER — Ambulatory Visit (HOSPITAL_COMMUNITY)
Admission: RE | Admit: 2023-11-25 | Discharge: 2023-11-25 | Disposition: A | Discharge status: Home / Self Care | Payer: Medicaid Other | Source: Ambulatory Visit | Attending: Obstetrics and Gynecology | Admitting: Obstetrics and Gynecology

## 2023-11-25 ENCOUNTER — Encounter (HOSPITAL_COMMUNITY): Payer: Self-pay

## 2023-11-25 DIAGNOSIS — R222 Localized swelling, mass and lump, trunk: Secondary | ICD-10-CM

## 2023-12-09 ENCOUNTER — Ambulatory Visit (INDEPENDENT_AMBULATORY_CARE_PROVIDER_SITE_OTHER): Payer: Medicaid Other

## 2023-12-09 ENCOUNTER — Telehealth: Payer: Self-pay

## 2023-12-09 VITALS — BP 131/80 | HR 85 | Wt 254.6 lb

## 2023-12-09 DIAGNOSIS — R1033 Periumbilical pain: Secondary | ICD-10-CM | POA: Diagnosis present

## 2023-12-09 NOTE — Telephone Encounter (Signed)
Called patient and gave her the number to reschedule her appointment for the CT.  Glennie Hawk, CMA

## 2023-12-09 NOTE — Progress Notes (Signed)
     SUBJECTIVE:   CHIEF COMPLAINT / HPI:    is a 32yo F w/ hx of fibroids and AUB that p/f continued abdominal pain.  - Previously saw her 11/26 for same problem, recommended CTAP at that time. Since then, I have spoken to pt during her child's visit, and she told me she canceled the CTAP because she was worried about to IV medication they were giving her for the CT.  - Denies diarrhea, blood in stool, vomiting, or vaginal bleeding - Denies fever - Has to wear looser pants because it hurts for the waistband to touch her LLQ abdomen.   OBJECTIVE:   BP 131/80   Pulse 85   Wt 254 lb 9.6 oz (115.5 kg)   LMP 11/02/2023   SpO2 99%   BMI 38.71 kg/m   General: Alert, uncomfortable appearing woman. NAD. HEENT: NCAT. MMM. CV: RRR, no murmurs.  Resp: CTAB, no wheezing or crackles. Normal WOB on RA.  Abm: Tender to palpation in left lower quadrant.  Soft, nondistended.  No guarding, rigidity, rebound.  BS present. Ext: Moves all ext spontaneously Skin: Warm, well perfused   ASSESSMENT/PLAN:   Assessment & Plan Periumbilical abdominal pain Continues to have periumbilical abm pain. No red flag symptoms such as weight loss, inability to tolerate p.o., peritoneal signs, fever, blood in stool.  Prior plan was to get a CTAP to further characterize a periumbilical mass that was noted on abdominal ultrasound.  Patient had to reschedule due to concern of IV contrast, it appears the process was potentially not explained well to patient and she was apprehensive of the procedure.  I counseled patient on the risk potential allergic reactions to IV contrast but that the procedure is overall low risk.  Patient is agreeable to rescheduling CT. - Continue Tylenol prn for pain - Reschedule CT abm   Lincoln Brigham, MD [facility]

## 2023-12-09 NOTE — Patient Instructions (Signed)
Good to see you today - Thank you for coming in   Things we discussed today:   1) For your abdominal pain - We will reschedule your CT scan. You will receive an IV medication there called "contrast", which is to help improve the CT images. - Please take Tylenol as needed for abdominal pain - You can also apply a heating pad, or take a warm shower/bath to help with the pain   Please seek further close attention if you: - are vomiting uncontrollably -Abdominal pain becomes unbearable -You start having blood in your stools -You are unable to have bowel movements

## 2023-12-09 NOTE — Assessment & Plan Note (Signed)
Continues to have periumbilical abm pain. No red flag symptoms such as weight loss, inability to tolerate p.o., peritoneal signs, fever, blood in stool.  Prior plan was to get a CTAP to further characterize a periumbilical mass that was noted on abdominal ultrasound.  Patient had to reschedule due to concern of IV contrast, it appears the process was potentially not explained well to patient and she was apprehensive of the procedure.  I counseled patient on the risk potential allergic reactions to IV contrast but that the procedure is overall low risk.  Patient is agreeable to rescheduling CT. - Continue Tylenol prn for pain - Reschedule CT abm

## 2023-12-10 ENCOUNTER — Telehealth: Payer: Self-pay | Admitting: Family Medicine

## 2023-12-10 NOTE — Telephone Encounter (Signed)
Called pt with [employer] # KAYV and pt requested to have her CT rescheduled at [facility] 12/12/23 at 0815.  Pt agreed to the appt.  I explained to the pt the purpose of the IV and contrast.  Pt verbalized understanding with no further questions.   Lateesha Bezold,RN  12/10/23

## 2023-12-10 NOTE — Telephone Encounter (Signed)
Patient called in stating she will like to get her CT Scan rescheduled.

## 2023-12-12 ENCOUNTER — Ambulatory Visit (HOSPITAL_COMMUNITY): Payer: Medicaid Other

## 2023-12-16 ENCOUNTER — Ambulatory Visit (HOSPITAL_COMMUNITY): Payer: Medicaid Other

## 2023-12-16 ENCOUNTER — Other Ambulatory Visit: Payer: Self-pay

## 2023-12-16 ENCOUNTER — Emergency Department (HOSPITAL_COMMUNITY): Payer: Medicaid Other

## 2023-12-16 ENCOUNTER — Encounter (HOSPITAL_COMMUNITY): Payer: Self-pay

## 2023-12-16 ENCOUNTER — Emergency Department (HOSPITAL_COMMUNITY)
Admission: EM | Admit: 2023-12-16 | Discharge: 2023-12-16 | Disposition: A | Discharge status: Home / Self Care | Payer: Medicaid Other | Attending: Emergency Medicine | Admitting: Emergency Medicine

## 2023-12-16 DIAGNOSIS — K3184 Gastroparesis: Secondary | ICD-10-CM | POA: Diagnosis not present

## 2023-12-16 DIAGNOSIS — R1032 Left lower quadrant pain: Secondary | ICD-10-CM | POA: Diagnosis present

## 2023-12-16 DIAGNOSIS — R1084 Generalized abdominal pain: Secondary | ICD-10-CM

## 2023-12-16 DIAGNOSIS — K439 Ventral hernia without obstruction or gangrene: Secondary | ICD-10-CM

## 2023-12-16 DIAGNOSIS — K469 Unspecified abdominal hernia without obstruction or gangrene: Secondary | ICD-10-CM | POA: Diagnosis not present

## 2023-12-16 LAB — URINALYSIS, ROUTINE W REFLEX MICROSCOPIC
Bilirubin Urine: NEGATIVE
Glucose, UA: NEGATIVE mg/dL
Ketones, ur: NEGATIVE mg/dL
Leukocytes,Ua: NEGATIVE
Nitrite: NEGATIVE
Protein, ur: 30 mg/dL — AB
Specific Gravity, Urine: 1.028 (ref 1.005–1.030)
pH: 8 (ref 5.0–8.0)

## 2023-12-16 LAB — COMPREHENSIVE METABOLIC PANEL
ALT: 11 U/L (ref 0–44)
AST: 14 U/L — ABNORMAL LOW (ref 15–41)
Albumin: 3.8 g/dL (ref 3.5–5.0)
Alkaline Phosphatase: 97 U/L (ref 38–126)
Anion gap: 6 (ref 5–15)
BUN: 5 mg/dL — ABNORMAL LOW (ref 6–20)
CO2: 26 mmol/L (ref 22–32)
Calcium: 9.4 mg/dL (ref 8.9–10.3)
Chloride: 104 mmol/L (ref 98–111)
Creatinine, Ser: 0.76 mg/dL (ref 0.44–1.00)
GFR, Estimated: >60 mL/min (ref >60)
Glucose, Bld: 94 mg/dL (ref 70–99)
Potassium: 3.4 mmol/L — ABNORMAL LOW (ref 3.5–5.1)
Sodium: 136 mmol/L (ref 135–145)
Total Bilirubin: 0.2 mg/dL (ref <1.2)
Total Protein: 7.8 g/dL (ref 6.5–8.1)

## 2023-12-16 LAB — CBC
HCT: 35.4 % — ABNORMAL LOW (ref 36.0–46.0)
Hemoglobin: 10.5 g/dL — ABNORMAL LOW (ref 12.0–15.0)
MCH: 21.8 pg — ABNORMAL LOW (ref 26.0–34.0)
MCHC: 29.7 g/dL — ABNORMAL LOW (ref 30.0–36.0)
MCV: 73.6 fL — ABNORMAL LOW (ref 80.0–100.0)
Platelets: 283 10*3/uL (ref 150–400)
RBC: 4.81 MIL/uL (ref 3.87–5.11)
RDW: 17.6 % — ABNORMAL HIGH (ref 11.5–15.5)
WBC: 4.8 10*3/uL (ref 4.0–10.5)
nRBC: 0 % (ref 0.0–0.2)

## 2023-12-16 LAB — HCG, SERUM, QUALITATIVE: Preg, Serum: NEGATIVE

## 2023-12-16 LAB — LIPASE, BLOOD: Lipase: 34 U/L (ref 11–51)

## 2023-12-16 MED ORDER — PANTOPRAZOLE SODIUM 20 MG PO TBEC: 20.0000 mg | DELAYED_RELEASE_TABLET | Freq: Every day | ORAL | 0 refills | Status: DC

## 2023-12-16 MED ORDER — ALUM & MAG HYDROXIDE-SIMETH 200-200-20 MG/5ML PO SUSP
30.0000 mL | Freq: Once | ORAL | Status: AC
  Administered 2023-12-16: 30 mL via ORAL
  Filled 2023-12-16: qty 30

## 2023-12-16 MED ORDER — IOHEXOL 350 MG/ML SOLN
75.0000 mL | Freq: Once | INTRAVENOUS | Status: AC | PRN
  Administered 2023-12-16: 75 mL via INTRAVENOUS

## 2023-12-16 MED ORDER — ACETAMINOPHEN 325 MG PO TABS
650.0000 mg | ORAL_TABLET | Freq: Once | ORAL | Status: AC
  Administered 2023-12-16: 650 mg via ORAL
  Filled 2023-12-16: qty 2

## 2023-12-16 NOTE — ED Triage Notes (Signed)
Pt reports LLQ abdominal pain that started last year. She reports the pain is getting worse. Denies n/v/d.

## 2023-12-16 NOTE — ED Notes (Signed)
Patient verbalizes understanding of discharge instructions. Opportunity for questioning and answers were provided. Armband removed by staff, pt discharged from ED. Pt ambulatory to ED waiting room with steady gait.  

## 2023-12-16 NOTE — ED Provider Notes (Signed)
[place] [facility] EMERGENCY DEPARTMENT AT [facility] Provider Note   CSN: [id number] Arrival date & time: 12/16/23  1717     History  Chief Complaint  Patient presents with   Abdominal Pain    Kathy Richards is a 32 y.o. female.  Patient complains of left lower quadrant abdominal pain.  Patient reports that she saw her primary care physician and was scheduled to have a CT scan.  Patient reports they called and canceled her CT scan.  Patient was told to come to the emergency department if pain got worse.  Patient has been seen by gynecology and had a ultrasound that was suspicious for a mass and was advised to have CT scan.  Patient denies any fever or chills she has not had any cough or congestion patient denies any nausea or vomiting or diarrhea.  She is not having any UTI symptoms she denies any STD symptoms   Abdominal Pain      Home Medications Prior to Admission medications   Medication Sig Start Date End Date Taking? Authorizing Provider  ferrous sulfate 324 MG TBEC Take 1 tablet (324 mg total) by mouth every other day. 09/18/23   Westley Chandler, MD  letrozole [facility]) 2.5 MG tablet Take 1 tablet (2.5 mg total) by mouth daily. Take on days 3 to 7 following a spontaneous menses or progestin-induced bleed. 10/16/23   Lorriane Shire, MD  naproxen (NAPROSYN) 375 MG tablet Take 1 tablet (375 mg total) by mouth 2 (two) times daily. 10/10/23   Garrison, [place] N, FNP  pantoprazole (PROTONIX) 20 MG tablet Take 1 tablet (20 mg total) by mouth daily. 10/10/23 11/09/23  Garrison, [place] N, FNP  progesterone (PROMETRIUM) 100 MG capsule Take 100 mg by mouth daily.    [provider]  tranexamic acid (LYSTEDA) 650 MG TABS tablet Take 2 tablets (1,300 mg total) by mouth 3 (three) times daily. Take during menses for a maximum of five days 09/18/23   Westley Chandler, MD      Allergies    Patient has no known allergies.    Review of Systems   Review of Systems   Gastrointestinal:  Positive for abdominal pain.  All other systems reviewed and are negative.   Physical Exam Updated Vital Signs BP (!) 143/73 (BP Location: Right Arm)   Pulse 73   Temp 98.5 F (36.9 C) (Oral)   Resp 16   Ht 5\' 8"  (1.727 m)   Wt 115.2 kg   LMP 12/15/2023 (Approximate)   SpO2 100%   BMI 38.62 kg/m  Physical Exam Vitals and nursing note reviewed.  Constitutional:      Appearance: She is well-developed.  HENT:     Head: Normocephalic.  Cardiovascular:     Rate and Rhythm: Normal rate.  Pulmonary:     Effort: Pulmonary effort is normal.  Abdominal:     General: Bowel sounds are normal. There is no distension.     Palpations: Abdomen is soft.     Tenderness: There is abdominal tenderness in the periumbilical area and left upper quadrant.     Hernia: No hernia is present.  Musculoskeletal:        General: Normal range of motion.     Cervical back: Normal range of motion.  Skin:    General: Skin is warm.  Neurological:     General: No focal deficit present.     Mental Status: She is alert and oriented to person, place, and time.  ED Results / Procedures / Treatments   Labs (all labs ordered are listed, but only abnormal results are displayed) Labs Reviewed  COMPREHENSIVE METABOLIC PANEL - Abnormal; Notable for the following components:      Result Value   Potassium 3.4 (*)    BUN 5 (*)    AST 14 (*)    All other components within normal limits  CBC - Abnormal; Notable for the following components:   Hemoglobin 10.5 (*)    HCT 35.4 (*)    MCV 73.6 (*)    MCH 21.8 (*)    MCHC 29.7 (*)    RDW 17.6 (*)    All other components within normal limits  URINALYSIS, ROUTINE W REFLEX MICROSCOPIC - Abnormal; Notable for the following components:   APPearance HAZY (*)    Hgb urine dipstick MODERATE (*)    Protein, ur 30 (*)    Bacteria, UA RARE (*)    All other components within normal limits  LIPASE, BLOOD  HCG, SERUM, QUALITATIVE     EKG None  Radiology No results found.  Procedures Procedures    Medications Ordered in ED Medications  iohexol (OMNIPAQUE) 350 MG/ML injection 75 mL (75 mLs Intravenous Contrast Given 12/16/23 1909)    ED Course/ Medical Decision Making/ A&P                                 Medical Decision Making Patient was scheduled for a CT scan that was canceled.  Patient complains of increased left-sided abdominal pain.  Patient has had a mass that was diagnosed on ultrasound  Amount and/or Complexity of Data Reviewed External Data Reviewed: labs.    Details: Labs ordered reviewed and interpreted.  Patient's hemoglobin is 10.5 Labs: ordered.    Details: Labs ordered reviewed and interpreted. Normal wbc count Radiology: ordered.    Details: Ct scan abdomen shows hernia and possible gastoparesis.   Risk Prescription drug management. Risk Details: Pt counseled on findings.  Pt advised to follow up with her Md as scheduled and follow up with gi.            Final Clinical Impression(s) / ED Diagnoses Final diagnoses:  Generalized abdominal pain  Hernia of abdominal wall  Gastroparesis    Rx / DC Orders ED Discharge Orders     None     An After Visit Summary was printed and given to the patient.     Osie Cheeks 12/16/23 2147    Benjiman Core, MD 12/16/23 [phone]

## 2023-12-16 NOTE — ED Provider Triage Note (Signed)
Emergency Medicine Provider Triage Evaluation Note  Kathy Richards , a 32 y.o. female  was evaluated in triage.  Pt complains of left sided abdominal pain   Review of Systems  Positive: pain Negative: fever  Physical Exam  BP (!) 143/73 (BP Location: Right Arm)   Pulse 73   Temp 98.5 F (36.9 C) (Oral)   Resp 16   Ht 5\' 8"  (1.727 m)   Wt 115.2 kg   LMP 12/15/2023 (Approximate)   SpO2 100%   BMI 38.62 kg/m  Gen:   Awake, no distress   Resp:  Normal effort  MSK:   Moves extremities without difficulty  Other:  Tender left abdomen  Medical Decision Making  Medically screening exam initiated at 6:08 PM.  Appropriate orders placed.  Imanii Suhre was informed that the remainder of the evaluation will be completed by another provider, this initial triage assessment does not replace that evaluation, and the importance of remaining in the ED until their evaluation is complete.     Elson Areas, [place] 12/16/23 1610

## 2023-12-17 ENCOUNTER — Telehealth: Payer: Self-pay | Admitting: Family Medicine

## 2023-12-17 DIAGNOSIS — R1013 Epigastric pain: Secondary | ICD-10-CM

## 2023-12-17 NOTE — Telephone Encounter (Signed)
Referral to GI placed. Once sent, will call to schedule and let patient know.

## 2023-12-24 ENCOUNTER — Ambulatory Visit: Payer: Medicaid Other | Admitting: Gastroenterology

## 2024-02-17 NOTE — Telephone Encounter (Signed)
 Please disregard error.

## 2024-02-23 NOTE — Progress Notes (Deleted)
 [facility] Initial Consultation   Referring Provider Westley Chandler, MD [address] [place],  [place] [address]  Primary Care Provider Westley Chandler, MD  Patient Profile: Kathy Richards is a 33 y.o. female who is seen in consultation in the [facility] at the request of Dr. Manson Passey for evaluation and management of the problem(s) noted below.  Problem List: Abdominal pain History of H. pylori infection diagnosed by breath test 11/2022 status post Pylera Iron deficiency anemia   History of Present Illness   Kathy Richards is a 33 y.o. female with a history of abnormal uterine bleeding, adnexal cyst, obesity, latent tuberculosis, iron deficiency anemia.   Last colonoscopy: *** Last endoscopy: ***  Last Abd CT/CTE/MRE: 12/16/23 -marked distention of the stomach with ingested material.  Decompressed small bowel and colon.  Changes may reflect gastric outlet obstruction or gastroparesis.  Small anterior abdominal wall hernia containing bowel without obstruction.  GI Review of Symptoms Significant for {GIROS:50592}. Otherwise negative.  General Review of Systems  Review of systems is significant for the pertinent positives and negatives as listed per the HPI.  Full ROS is otherwise negative.  Past Medical History   Past Medical History:  Diagnosis Date   GERD (gastroesophageal reflux disease)    Microcytic anemia 11/21/2020   Obesity      Past Surgical History   Past Surgical History:  Procedure Laterality Date   CESAREAN SECTION     x2     Allergies and Medications  No Known Allergies  @MEDSTODAY @  Family History   Family History  Problem Relation Age of Onset   HIV Mother    Hypertension Father      Social History   Social History   Tobacco Use   Smoking status: Never   Smokeless tobacco: Never  Vaping Use   Vaping status: Never Used  Substance Use Topics   Alcohol use: Not Currently   Drug use: Never   Valentine  reports that she has never smoked. She has never used smokeless tobacco. She reports that she does not currently use alcohol. She reports that she does not use drugs.  Vital Signs and Physical Examination  There were no vitals filed for this visit. There is no height or weight on file to calculate BMI.    General: Well developed, well nourished, no acute distress Head: Normocephalic and atraumatic Eyes: Sclerae anicteric, EOMI Ears: Normal auditory acuity Mouth: No deformities or lesions noted Lungs: Clear throughout to auscultation Heart: Regular rate and rhythm; No murmurs, rubs or bruits Abdomen: Soft, non tender and non distended. No masses, hepatosplenomegaly or hernias noted. Normal Bowel sounds Rectal: Musculoskeletal: Symmetrical with no gross deformities  Pulses:  Normal pulses noted Extremities: No edema or deformities noted Neurological: Alert oriented x 4, grossly nonfocal Psychological:  Alert and cooperative. Normal mood and affect  Review of Data  The following data was reviewed at the time of this encounter:  Laboratory Studies      Latest Ref Rng & Units 12/16/2023    5:45 PM 11/11/2023   11:07 AM 10/16/2023    2:40 PM  CBC  WBC 4.0 - 10.5 K/uL 4.8  5.1  4.8   Hemoglobin 12.0 - 15.0 g/dL 60.4  54.0  9.9   Hematocrit 36.0 - 46.0 % 35.4  34.1  35.1   Platelets 150 - 400 K/uL 283  248  302     Lab Results  Component Value Date   LIPASE 34 12/16/2023  Latest Ref Rng & Units 12/16/2023    5:45 PM 11/11/2023   11:07 AM 09/03/2023   10:35 AM  CMP  Glucose 70 - 99 mg/dL 94  88  161   BUN 6 - 20 mg/dL 5  9  5    Creatinine 0.44 - 1.00 mg/dL 0.96  0.45  4.09   Sodium 135 - 145 mmol/L 136  136  137   Potassium 3.5 - 5.1 mmol/L 3.4  4.1  4.0   Chloride 98 - 111 mmol/L 104  104  104   CO2 22 - 32 mmol/L 26  21  21    Calcium 8.9 - 10.3 mg/dL 9.4  9.3  9.3   Total Protein 6.5 - 8.1 g/dL 7.8  7.1    Total Bilirubin <1.2 mg/dL 0.2  <8.1    Alkaline Phos 38  - 126 U/L 97  127    AST 15 - 41 U/L 14  14    ALT 0 - 44 U/L 11  11     H. pylori breath test + 11/2022  Imaging Studies  CTAP 11/2023 1. Marked distention of the stomach with ingested material. Decompressed small bowel and colon. Changes may reflect gastric outlet obstruction or gastroparesis. Consider gastric emptying study for further evaluation. 2. Small anterior abdominal wall hernia containing small bowel without evidence of proximal obstruction at this level.  US Pelvis 09/2023 1. Small uterine fibroids, described above. 2. Incidentally identified abdominal wall suspicious 3.1 cm mass. Additional evaluation recommended with contrast-enhanced CT.  GI Procedures and Studies      Clinical Impression  It is my clinical impression that Kathy Richards is a 33 y.o. female with;  ***  Plan  *** *** *** *** ***  Planned Follow Up No follow-ups on file.  The patient or caregiver verbalized understanding of the material covered, with no barriers to understanding. All questions were answered. Patient or caregiver is agreeable with the plan outlined above.    It was a pleasure to see Kathy Richards.  If you have any questions or concerns regarding this evaluation, do not hesitate to contact me.  Maren Beach, MD [facility]

## 2024-02-24 ENCOUNTER — Ambulatory Visit: Payer: Medicaid Other | Admitting: Pediatrics

## 2024-02-24 DIAGNOSIS — R1033 Periumbilical pain: Secondary | ICD-10-CM

## 2024-02-24 DIAGNOSIS — A048 Other specified bacterial intestinal infections: Secondary | ICD-10-CM

## 2024-02-24 DIAGNOSIS — D500 Iron deficiency anemia secondary to blood loss (chronic): Secondary | ICD-10-CM

## 2024-04-20 ENCOUNTER — Ambulatory Visit: Admitting: Physician Assistant

## 2024-04-20 ENCOUNTER — Encounter: Payer: Self-pay | Admitting: Physician Assistant

## 2024-04-20 ENCOUNTER — Other Ambulatory Visit (INDEPENDENT_AMBULATORY_CARE_PROVIDER_SITE_OTHER)

## 2024-04-20 VITALS — BP 118/68 | HR 71 | Ht 67.0 in | Wt 260.0 lb

## 2024-04-20 DIAGNOSIS — D509 Iron deficiency anemia, unspecified: Secondary | ICD-10-CM

## 2024-04-20 DIAGNOSIS — K436 Other and unspecified ventral hernia with obstruction, without gangrene: Secondary | ICD-10-CM

## 2024-04-20 DIAGNOSIS — R09A2 Foreign body sensation, throat: Secondary | ICD-10-CM | POA: Diagnosis not present

## 2024-04-20 DIAGNOSIS — R6881 Early satiety: Secondary | ICD-10-CM

## 2024-04-20 DIAGNOSIS — N920 Excessive and frequent menstruation with regular cycle: Secondary | ICD-10-CM

## 2024-04-20 DIAGNOSIS — R1032 Left lower quadrant pain: Secondary | ICD-10-CM

## 2024-04-20 DIAGNOSIS — R933 Abnormal findings on diagnostic imaging of other parts of digestive tract: Secondary | ICD-10-CM

## 2024-04-20 LAB — IBC + FERRITIN
Ferritin: 5.7 ng/mL — ABNORMAL LOW (ref 10.0–291.0)
Iron: 20 ug/dL — ABNORMAL LOW (ref 42–145)
Saturation Ratios: 3.9 % — ABNORMAL LOW (ref 20.0–50.0)
TIBC: 508.2 ug/dL — ABNORMAL HIGH (ref 250.0–450.0)
Transferrin: 363 mg/dL — ABNORMAL HIGH (ref 212.0–360.0)

## 2024-04-20 LAB — CBC WITH DIFFERENTIAL/PLATELET
Basophils Absolute: 0 10*3/uL (ref 0.0–0.1)
Basophils Relative: 1.2 % (ref 0.0–3.0)
Eosinophils Absolute: 0.1 10*3/uL (ref 0.0–0.7)
Eosinophils Relative: 1.6 % (ref 0.0–5.0)
HCT: 29.8 % — ABNORMAL LOW (ref 36.0–46.0)
Hemoglobin: 9.1 g/dL — ABNORMAL LOW (ref 12.0–15.0)
Lymphocytes Relative: 50.1 % — ABNORMAL HIGH (ref 12.0–46.0)
Lymphs Abs: 2.1 10*3/uL (ref 0.7–4.0)
MCHC: 30.7 g/dL (ref 30.0–36.0)
MCV: 64.3 fl — ABNORMAL LOW (ref 78.0–100.0)
Monocytes Absolute: 0.4 10*3/uL (ref 0.1–1.0)
Monocytes Relative: 9.9 % (ref 3.0–12.0)
Neutro Abs: 1.5 10*3/uL (ref 1.4–7.7)
Neutrophils Relative %: 37.2 % — ABNORMAL LOW (ref 43.0–77.0)
Platelets: 241 10*3/uL (ref 150.0–400.0)
RBC: 4.64 Mil/uL (ref 3.87–5.11)
RDW: 16.6 % — ABNORMAL HIGH (ref 11.5–15.5)
WBC: 4.1 10*3/uL (ref 4.0–10.5)

## 2024-04-20 LAB — COMPREHENSIVE METABOLIC PANEL WITH GFR
ALT: 8 U/L (ref 0–35)
AST: 11 U/L (ref 0–37)
Albumin: 4.3 g/dL (ref 3.5–5.2)
Alkaline Phosphatase: 92 U/L (ref 39–117)
BUN: 8 mg/dL (ref 6–23)
CO2: 24 meq/L (ref 19–32)
Calcium: 9.2 mg/dL (ref 8.4–10.5)
Chloride: 105 meq/L (ref 96–112)
Creatinine, Ser: 0.62 mg/dL (ref 0.40–1.20)
GFR: 119.54 mL/min (ref >60.00)
Glucose, Bld: 97 mg/dL (ref 70–99)
Potassium: 3.9 meq/L (ref 3.5–5.1)
Sodium: 135 meq/L (ref 135–145)
Total Bilirubin: 0.4 mg/dL (ref 0.2–1.2)
Total Protein: 8 g/dL (ref 6.0–8.3)

## 2024-04-20 MED ORDER — NA SULFATE-K SULFATE-MG SULF 17.5-3.13-1.6 GM/177ML PO SOLN: 1.0000 | Freq: Once | ORAL | 0 refills | Status: AC

## 2024-04-20 NOTE — Progress Notes (Signed)
 I agree with the assessment and plan as outlined by Ms. Steffanie Dunn.

## 2024-04-20 NOTE — Patient Instructions (Addendum)
 Your provider has requested that you go to the basement level for lab work before leaving today. Press "B" on the elevator. The lab is located at the first door on the left as you exit the elevator.  No aleve , ibuprofen , goody powders, as these are antiinflammatories and can cause inflammation in your stomach, increase bleeding risk and cause ulcers.  You can talk with PCP about alternative pain options.  Can do tyelnol max 3000 mg a day, salon pas patches are over the counter   Follow up with GYN for your menses  You will be contacted by [facility] in the next 2 days to arrange a Abdominal Ultrasound.  The number on your caller ID will be [phone], please answer when they call.  If you have not heard from them in 2 days please call [phone] to schedule.     You have been scheduled for an endoscopy and colonoscopy. Please follow the written instructions given to you at your visit today.  If you use inhalers (even only as needed), please bring them with you on the day of your procedure.  DO NOT TAKE 7 DAYS PRIOR TO TEST- Trulicity (dulaglutide) Ozempic, Wegovy (semaglutide) Mounjaro (tirzepatide) Bydureon Bcise (exanatide extended release)  DO NOT TAKE 1 DAY PRIOR TO YOUR TEST Rybelsus (semaglutide) Adlyxin (lixisenatide) Victoza (liraglutide) Byetta (exanatide) ___________________________________________________________________________   Due to recent changes in healthcare laws, you may see the results of your imaging and laboratory studies on MyChart before your provider has had a chance to review them.  We understand that in some cases there may be results that are confusing or concerning to you. Not all laboratory results come back in the same time frame and the provider may be waiting for multiple results in order to interpret others.  Please give us  48 hours in order for your provider to thoroughly review all the results before contacting the office for  clarification of your results.

## 2024-04-20 NOTE — Progress Notes (Signed)
 04/20/2024 Kathy Richards [id number] January 30, 1991  Referring provider: Azell Boll, MD Primary GI doctor: Dr. Rosaline Coma  ASSESSMENT AND PLAN:   Abdominal pain left lower quadrant since end of 2023, has gotten worse, some globlus sensation early satiety, no alarm symptoms Normal Bm's daily, formed, no melena/no hematochezia  LLQ pain can be worse with tight jeans, bending, and worse around her menses With anemia, negative lipase, normal liver function however previously has had isolated elevated alk phos 12/16/2023 ER visit for abdominal pain CT abdomen pelvis with contrast marked distention of the stomach with ingested material decompressed small bowel and colon may reflect gastric outlet obstruction or gastroparesis consider GES, small anterior abdominal wall hernia containing small bowel without evidence of proximal obstruction at this level - Follow-up with GYN for potential endometriosis or gynecological causes. - Use Salonpas patches, Tylenol , and heating pad for pain. - Schedule endoscopy and colonoscopy for GI evaluation, minimal upper GI symptoms, possible over read if EGD negative, can consider GES - Order limited abdominal ultrasound for hernia assessment.  Microcytic anemia/IDA with menorrhagia, can have 5-6 days, can bleed through a pad every hour or two, can not keep count Longstanding history of the last year down to 6.7 in May 2024 most recently 10.5, normal platelets 12/16/2023  HGB 10.5 MCV 73.6 Platelets 283 05/03/2023 Iron  11 Ferritin 7 was getting iron  infusions Recent Labs    05/03/23 1223 05/06/23 1320 08/20/23 1619 08/21/23 1227 10/16/23 1440 11/11/23 1107 12/16/23 1745  HGB 6.7* 6.7* 8.2* 7.9* 9.9* 10.0* 10.5*  Anemia likely due to heavy menstrual bleeding. Iron  infusions given in the past.  GI causes to be ruled out with endoscopy and colonoscopy. - Schedule endoscopy and colonoscopy for GI evaluation. Risk of bowel prep, conscious sedation, and EGD  and colonoscopy were discussed.  Risks include but are not limited to dehydration, pain, bleeding, cardiopulmonary process, bowel perforation, or other possible adverse outcomes..  Treatment plan was discussed with patient, and agreed upon. - Follow-up with GYN for heavy menstrual bleeding. - recheck iron , ferritin, consider IV infusions again  Patient Care Team: Azell Boll, MD as PCP - General (Family Medicine) Azell Boll, MD (Family Medicine) Azell Boll, MD (Family Medicine) Elie Grove, LCSW as [facility] Care Management (Licensed Clinical Social Worker)  HISTORY OF PRESENT ILLNESS: 33 y.o. female with a past medical history listed below presents as a new patient for evaluation of epigastric pain.   Discussed the use of AI scribe software for clinical note transcription with the patient, who gave verbal consent to proceed.  History of Present Illness   Kathy Richards is a 33 year old female who presents with left lower quadrant abdominal pain.  She has been experiencing left lower quadrant abdominal pain since the end of 2023, which has progressively worsened. The pain is located below the umbilicus and is exacerbated by wearing tight clothing and movement, particularly bending. The pain occurs daily and sometimes intensifies around her menstrual period.  No upper gastrointestinal symptoms such as nausea, vomiting, or heartburn. She experiences a sensation of food being stuck when consuming cold foods and reports early satiety. No changes in bowel habits, maintaining a daily bowel movement without diarrhea, constipation, or nocturnal symptoms. No dark or bloody stools.  She has a history of anemia, previously treated with iron  infusions, attributed to menorrhagia. Her menstrual periods last five to six days with heavy bleeding requiring frequent pad changes. She is not currently on medication for  menstrual bleeding but has been prescribed medication in  the past, though she does not recall the name.      She  reports that she has never smoked. She has never used smokeless tobacco. She reports that she does not currently use alcohol. She reports that she does not use drugs.  RELEVANT GI HISTORY, IMAGING AND LABS: Results   RADIOLOGY CT abdomen: Marked distention of the stomach, decompressed small bowel and colon may reflect gastric outlet obstruction or gastroparesis. Abdominal wall hernia containing small bowel without evidence of proximal obstruction. Ventral abdominal wall hernia to the left of the midline with a small amount of small bowel. (12/16/2023)     12/16/2023 CT with contrast  IMPRESSION: 1. Marked distention of the stomach with ingested material. Decompressed small bowel and colon. Changes may reflect gastric outlet obstruction or gastroparesis. Consider gastric emptying study for further evaluation. 2. Small anterior abdominal wall hernia containing small bowel without evidence of proximal obstruction at this level.   CBC    Component Value Date/Time   WBC 4.8 12/16/2023 1745   RBC 4.81 12/16/2023 1745   HGB 10.5 (L) 12/16/2023 1745   HGB 10.0 (L) 11/11/2023 1107   HCT 35.4 (L) 12/16/2023 1745   HCT 34.1 11/11/2023 1107   PLT 283 12/16/2023 1745   PLT 248 11/11/2023 1107   MCV 73.6 (L) 12/16/2023 1745   MCV 73 (L) 11/11/2023 1107   MCH 21.8 (L) 12/16/2023 1745   MCHC 29.7 (L) 12/16/2023 1745   RDW 17.6 (H) 12/16/2023 1745   RDW 24.4 (H) 11/11/2023 1107   LYMPHSABS 2.1 11/11/2023 1107   MONOABS 0.1 05/06/2023 1320   EOSABS 0.1 11/11/2023 1107   BASOSABS 0.1 11/11/2023 1107   Recent Labs    05/03/23 1223 05/06/23 1320 08/20/23 1619 08/21/23 1227 10/16/23 1440 11/11/23 1107 12/16/23 1745  HGB 6.7* 6.7* 8.2* 7.9* 9.9* 10.0* 10.5*    CMP     Component Value Date/Time   NA 136 12/16/2023 1745   NA 136 11/11/2023 1107   K 3.4 (L) 12/16/2023 1745   CL 104 12/16/2023 1745   CO2 26 12/16/2023 1745    GLUCOSE 94 12/16/2023 1745   BUN 5 (L) 12/16/2023 1745   BUN 9 11/11/2023 1107   CREATININE 0.76 12/16/2023 1745   CALCIUM 9.4 12/16/2023 1745   PROT 7.8 12/16/2023 1745   PROT 7.1 11/11/2023 1107   ALBUMIN 3.8 12/16/2023 1745   ALBUMIN 4.0 11/11/2023 1107   AST 14 (L) 12/16/2023 1745   ALT 11 12/16/2023 1745   ALKPHOS 97 12/16/2023 1745   BILITOT 0.2 12/16/2023 1745   BILITOT <0.2 11/11/2023 1107   GFRNONAA >60 12/16/2023 1745   GFRAA 141 10/17/2020 0954      Latest Ref Rng & Units 12/16/2023    5:45 PM 11/11/2023   11:07 AM 08/21/2023   12:27 PM  Hepatic Function  Total Protein 6.5 - 8.1 g/dL 7.8  7.1  7.7   Albumin 3.5 - 5.0 g/dL 3.8  4.0  3.6   AST 15 - 41 U/L 14  14  14    ALT 0 - 44 U/L 11  11  12    Alk Phosphatase 38 - 126 U/L 97  127  96   Total Bilirubin <1.2 mg/dL 0.2  <1.3  <0.8       Current Medications:   Current Outpatient Medications (Endocrine & Metabolic):    progesterone  (PROMETRIUM ) 100 MG capsule, Take 100 mg by mouth daily. (Patient not taking:  Reported on 04/20/2024)    Current Outpatient Medications (Analgesics):    naproxen  (NAPROSYN ) 375 MG tablet, Take 1 tablet (375 mg total) by mouth 2 (two) times daily. (Patient not taking: Reported on 04/20/2024)  Current Outpatient Medications (Hematological):    ferrous sulfate  324 MG TBEC, Take 1 tablet (324 mg total) by mouth every other day.   tranexamic acid  (LYSTEDA ) 650 MG TABS tablet, Take 2 tablets (1,300 mg total) by mouth 3 (three) times daily. Take during menses for a maximum of five days (Patient not taking: Reported on 04/20/2024)  Current Outpatient Medications (Other):    letrozole  (FEMARA ) 2.5 MG tablet, Take 1 tablet (2.5 mg total) by mouth daily. Take on days 3 to 7 following a spontaneous menses or progestin-induced bleed. (Patient not taking: Reported on 04/20/2024)   pantoprazole  (PROTONIX ) 20 MG tablet, Take 1 tablet (20 mg total) by mouth daily.  Medical History:  Past Medical History:   Diagnosis Date   GERD (gastroesophageal reflux disease)    Microcytic anemia 11/21/2020   Obesity    Allergies: No Known Allergies   Surgical History:  She  has a past surgical history that includes Cesarean section. Family History:  Her family history includes HIV in her mother; Hypertension in her father.  REVIEW OF SYSTEMS  : All other systems reviewed and negative except where noted in the History of Present Illness.  PHYSICAL EXAM: BP 118/68   Pulse 71   Ht 5\' 7"  (1.702 m)   Wt 260 lb (117.9 kg)   BMI 40.72 kg/m  Physical Exam   GENERAL APPEARANCE: Well nourished, in no apparent distress. HEENT: No cervical lymphadenopathy, unremarkable thyroid , sclerae anicteric, conjunctiva pink. RESPIRATORY: Respiratory effort normal, breath sounds equal bilaterally without rales, rhonchi, or wheezing. CARDIO: Regular rate and rhythm with no murmurs, rubs, or gallops, peripheral pulses intact. ABDOMEN: Soft, non-distended, active bowel sounds in all four quadrants, tenderness to palpation, less on release, no rebound, no mass appreciated. RECTAL: Declines. MUSCULOSKELETAL: Full range of motion, normal gait, without edema. SKIN: Dry, intact without rashes or lesions. No jaundice. NEURO: Alert, oriented, no focal deficits. PSYCH: Cooperative, normal mood and affect.      Edmonia Gottron, PA-C 11:48 AM

## 2024-04-21 ENCOUNTER — Other Ambulatory Visit: Payer: Self-pay

## 2024-04-21 DIAGNOSIS — D509 Iron deficiency anemia, unspecified: Secondary | ICD-10-CM

## 2024-04-23 NOTE — Progress Notes (Deleted)
    SUBJECTIVE:   CHIEF COMPLAINT: follow up  The patient speaks Swahili as their primary language.  An interpreter was used for the entire visit.   HPI:   Kathy Richards is a 33 y.o.  with history notable for obesity, irregular menses, chronic abdominal pain  presenting for follow up.   PERTINENT  PMH / PSH/Family/Social History : ***  OBJECTIVE:   There were no vitals taken for this visit.  Today's weight:  Review of prior weights: There were no vitals filed for this visit.  ***  ASSESSMENT/PLAN:   Assessment & Plan Abnormal uterine bleeding  Iron  deficiency anemia due to chronic blood loss  Obesity (BMI 30-39.9)  Periumbilical abdominal pain    Otho Blitz, MD  Family Medicine Teaching Service  [facility]

## 2024-04-24 ENCOUNTER — Ambulatory Visit: Admitting: Family Medicine

## 2024-04-24 DIAGNOSIS — D500 Iron deficiency anemia secondary to blood loss (chronic): Secondary | ICD-10-CM

## 2024-04-24 DIAGNOSIS — E669 Obesity, unspecified: Secondary | ICD-10-CM

## 2024-04-24 DIAGNOSIS — N939 Abnormal uterine and vaginal bleeding, unspecified: Secondary | ICD-10-CM

## 2024-04-24 DIAGNOSIS — R1033 Periumbilical pain: Secondary | ICD-10-CM

## 2024-05-04 ENCOUNTER — Telehealth: Payer: Self-pay | Admitting: Family Medicine

## 2024-05-04 ENCOUNTER — Telehealth: Payer: Self-pay

## 2024-05-04 ENCOUNTER — Ambulatory Visit: Admitting: Student

## 2024-05-04 ENCOUNTER — Encounter: Payer: Self-pay | Admitting: Student

## 2024-05-04 VITALS — BP 128/64 | HR 70 | Ht 67.0 in | Wt 263.8 lb

## 2024-05-04 DIAGNOSIS — R1033 Periumbilical pain: Secondary | ICD-10-CM | POA: Diagnosis present

## 2024-05-04 DIAGNOSIS — D500 Iron deficiency anemia secondary to blood loss (chronic): Secondary | ICD-10-CM | POA: Diagnosis not present

## 2024-05-04 MED ORDER — ACETAMINOPHEN 500 MG PO TABS: 1000.0000 mg | ORAL_TABLET | Freq: Four times a day (QID) | ORAL | 0 refills | Status: DC | PRN

## 2024-05-04 NOTE — Patient Instructions (Addendum)
 It was great to see you today! Thank you for choosing [facility] for your primary care.  Today we addressed: This is for your ultrasound: You will be contacted by [facility] Scheduling in the next 2 days to arrange a Abdominal Ultrasound.  The number on your caller ID will be [phone], please answer when they call.  If you have not heard from them in 2 days please call [phone] to schedule.   You may try Tylenol  1000 mg every 6 hours as needed for pain. I have submitted referral for IV iron .  Please continue follow-up with your GI doc.  If you haven't already, sign up for My Chart to have easy access to your labs results, and communication with your primary care physician.  Return if symptoms worsen or fail to improve. Please arrive 15 minutes before your appointment to ensure smooth check in process.  We appreciate your efforts in making this happen.  Thank you for allowing me to participate in your care, Veronia Goon, DO 05/04/2024, 11:37 AM PGY-3, [facility]

## 2024-05-04 NOTE — Assessment & Plan Note (Signed)
 Reviewed prior imaging and notation, appropriately being worked up by GI.  Provided information to have her schedule abdominal ultrasound as recommended by them.  She is already scheduled for endoscopy/colonoscopy.  Advised Tylenol  only for pain, Rx sent.

## 2024-05-04 NOTE — Assessment & Plan Note (Signed)
 Referral placed for IV iron .  Reviewed recent blood work with hemoglobin 9.1.

## 2024-05-04 NOTE — Telephone Encounter (Signed)
 Auth Submission: NO AUTH NEEDED Site of care: Site of care: [facility] Payer: [employer] medicaid Medication & CPT/J Code(s) submitted: Feraheme  (ferumoxytol ) [phone] Route of submission (phone, fax, portal):  Phone # Fax # Auth type: Buy/Bill PB Units/visits requested: 510mg  x 2 doses Reference number:  Approval from: 05/04/24 to 10/04/24

## 2024-05-04 NOTE — Progress Notes (Signed)
  SUBJECTIVE:   CHIEF COMPLAINT / HPI:   Generalized abdominal pain: Left-sided abdominal pain since 2020 with increased severity approximately 2024.  Describes pain as sharp and worsens during menstrual periods.  She has regular bowel movements twice daily without constipation or diarrhea. She does not use medications for pain or bowel movements. An iron  infusion was administered last year due to low blood levels, with another infusion pending. There is no blood in stool or urine, vomiting, or other gastrointestinal symptoms. Her menstrual periods last five days, with the last period ending a week ago.  Has been seen prior for periumbilical pain with consequent CT abdomen/pelvis which revealed marked distention of stomach which may reflect gastric outlet obstruction or gastroparesis with recommendation of gastric emptying study for further evaluation.  There is also a small anterior abdominal wall hernia without evidence of obstruction.  She also has known small uterine fibroids.  She recently saw GI 2 weeks ago and has endoscopy/colonoscopy scheduled 06/10/2024.  Swahili interpreter used throughout encounter.  PERTINENT  PMH / PSH: Reviewed  OBJECTIVE:  BP 128/64   Pulse 70   Ht 5\' 7"  (1.702 m)   Wt 263 lb 12.8 oz (119.7 kg)   SpO2 100%   BMI 41.32 kg/m  General: Well-appearing, NAD Abdomen: Soft, tenderness over left periumbilical region, normoactive bowel sounds  ASSESSMENT/PLAN:   Assessment & Plan Periumbilical abdominal pain Reviewed prior imaging and notation, appropriately being worked up by GI.  Provided information to have her schedule abdominal ultrasound as recommended by them.  She is already scheduled for endoscopy/colonoscopy.  Advised Tylenol  only for pain, Rx sent. Iron  deficiency anemia due to chronic blood loss Referral placed for IV iron .  Reviewed recent blood work with hemoglobin 9.1. Return if symptoms worsen or fail to improve. Veronia Goon, DO 05/04/2024,  12:59 PM PGY-3, [facility]

## 2024-05-04 NOTE — Telephone Encounter (Signed)
 Patient referred to infusion pharmacy team for ambulatory infusion of IV iron .  Insurance - [place] [employer] Dx code - D50.0 IV Iron  Therapy - Feraheme  510 mg IV x 2  Infusion appointments - Scheduling team will schedule patient as soon as possible.    Kathy Richards, PharmD

## 2024-05-08 ENCOUNTER — Ambulatory Visit

## 2024-06-04 NOTE — Progress Notes (Deleted)
 [facility] CONSULT NOTE  Patient Care Team: Azell Boll, MD as PCP - General (Family Medicine) Azell Boll, MD (Family Medicine) Azell Boll, MD (Family Medicine) Elie Grove, LCSW as [facility] Care Management (Licensed Clinical Social Worker)  ASSESSMENT & PLAN:  Amiri is a 33 y.o. female with history of *** being seen for iron  deficiency anemia.  Report of heavy menstrual cycles. On 4/28 ferritin was 5.7. hgb 9.1 and MCV 64.  Last labs showed severe IDA. She had received IV iron  in the past.  The mechanism of IDA is due to either blood loss or decreased absorptive mechanism or both. We discussed some of the risks, benefits, and alternatives of intravenous iron  infusions. The patient is symptomatic from anemia and the iron  level is critically low. She tolerated oral iron  supplement poorly and desires to achieved higher levels of iron  faster for adequate hematopoesis. Some of the side-effects to be expected including risks of infusion reactions, phlebitis, headaches, nausea and fatigue.  The patient is willing to proceed. Patient education material was dispensed.   Ordering iv iron  to be given at [address]  IV iron  *** ordered. CBC, Iron , TIBC, ferritin Referral for colonoscopy and EGD  Assessment & Plan   No orders of the defined types were placed in this encounter.   Relevant history: History of *** Last colonoscopy: Last EGD: Periods:    All questions were answered. The patient knows to call the clinic with any problems, questions or concerns.  Lowanda Ruddy, MD 6/12/202510:41 PM   CHIEF COMPLAINTS/PURPOSE OF CONSULTATION:  Anemia  HISTORY OF PRESENTING ILLNESS:  Dhamar Yung 33 y.o. female is here because of anemia.  Monifa had not noticed any recent bleeding such as melena, hematuria or hematochezia Her last colonoscopy was *** ***She had no prior history or diagnosis of cancer. Her age appropriate screening  programs are up-to-date. ***She denies any pica and eats a variety of diet. ***She denies blood donation or received blood transfusion ***The patient was prescribed oral iron  supplements and she takes ***  MEDICAL HISTORY:  Past Medical History:  Diagnosis Date   GERD (gastroesophageal reflux disease)    Microcytic anemia 11/21/2020   Obesity     SURGICAL HISTORY: Past Surgical History:  Procedure Laterality Date   CESAREAN SECTION     x2    SOCIAL HISTORY: Social History   Socioeconomic History   Marital status: Single    Spouse name: Not on file   Number of children: 1   Years of education: Not on file   Highest education level: Not on file  Occupational History   Occupation: nif scanner  Tobacco Use   Smoking status: Never   Smokeless tobacco: Never  Vaping Use   Vaping status: Never Used  Substance and Sexual Activity   Alcohol use: Not Currently   Drug use: Never   Sexual activity: Yes    Partners: Male    Birth control/protection: None    Comment: husband in [place]   Other Topics Concern   Not on file  Social History Narrative   ** Merged History Encounter **       Mother is Mbiya Mulumba   Husband is still in [place]   Refugee Information Number of Immediate Family Members: 8 Number of Immediate Family Members in [place] : 7 Date of Arrival: 08/03/20 Country of Birth: Other Other Country of Birth:: [place] Count   ry of Origin: Other Other Country of Origin::  [place] Location of [address]: Other Other Location of Refugee Camp:: [place] Duration in [place]: 20 years or greater Reason for Leaving Home Country: Other Other Reason for Leaving Home Country:: born i   n the refugee camp Primary Language: Other Other Primary Language:: swahili, nyanja and understanding of english Able to Read in Primary Language: Yes (a little) Able to Write in Primary Language: Yes (a little) Education: [employer] (two years fro   m completing) Prior Work: business;  Secondary school teacher and shoes Marital Status: Married Sexual Activity: No Tuberculosis Screening Overseas: Negative Tuberculosis Screening Health Department: Not Completed Health Department Labs Co   mpleted: No History of Trauma: None Do You Feel Jumpy or Nervous?: No Are You Very Watchful or 'Super Alert'?: Yes (feels unsafe in her neighborhood.)     Social Drivers of Health   Financial Resource Strain: Medium Risk (05/02/2021)   Overall Financial Resource Strain (CARDIA)    Difficulty of Paying Living Expenses: Somewhat hard  Food Insecurity: Food Insecurity Present (08/20/2023)   Hunger Vital Sign    Worried About Running Out of Food in the Last Year: Sometimes true    Ran Out of Food in the Last Year: Sometimes true  Transportation Needs: No Transportation Needs (08/20/2023)   PRAPARE - Administrator, Civil Service (Medical): No    Lack of Transportation (Non-Medical): No  Physical Activity: Not on file  Stress: No Stress Concern Present (05/02/2021)   [employer] of Occupational Health - Occupational Stress Questionnaire    Feeling of Stress : Only a little  Social Connections: Not on file  Intimate Partner Violence: Not At Risk (05/02/2021)   Humiliation, Afraid, Rape, and Kick questionnaire    Fear of Current or Ex-Partner: No    Emotionally Abused: No    Physically Abused: No    Sexually Abused: No    FAMILY HISTORY: Family History  Problem Relation Age of Onset   HIV Mother    Hypertension Father    Liver disease Neg Hx    Colon cancer Neg Hx    Esophageal cancer Neg Hx     ALLERGIES:  has no known allergies.  MEDICATIONS:  Current Outpatient Medications  Medication Sig Dispense Refill   acetaminophen  (TYLENOL ) 500 MG tablet Take 2 tablets (1,000 mg total) by mouth every 6 (six) hours as needed. 90 tablet 0   ferrous sulfate  324 MG TBEC Take 1 tablet (324 mg total) by mouth every other day. 100 tablet 3   letrozole  (FEMARA )  2.5 MG tablet Take 1 tablet (2.5 mg total) by mouth daily. Take on days 3 to 7 following a spontaneous menses or progestin-induced bleed. (Patient not taking: Reported on 04/20/2024) 5 tablet 2   naproxen  (NAPROSYN ) 375 MG tablet Take 1 tablet (375 mg total) by mouth 2 (two) times daily. (Patient not taking: Reported on 04/20/2024) 20 tablet 0   pantoprazole  (PROTONIX ) 20 MG tablet Take 1 tablet (20 mg total) by mouth daily. 30 tablet 0   progesterone  (PROMETRIUM ) 100 MG capsule Take 100 mg by mouth daily. (Patient not taking: Reported on 04/20/2024)     tranexamic acid  (LYSTEDA ) 650 MG TABS tablet Take 2 tablets (1,300 mg total) by mouth 3 (three) times daily. Take during menses for a maximum of five days (Patient not taking: Reported on 04/20/2024) 30 tablet 2   No current facility-administered medications for this visit.    REVIEW OF SYSTEMS:   All relevant systems were reviewed with  the patient and are negative.  PHYSICAL EXAMINATION: ECOG PERFORMANCE STATUS: {CHL ONC ECOG [phone]}  There were no vitals filed for this visit. There were no vitals filed for this visit.  GENERAL: alert, no distress and comfortable SKIN: skin color normal EYES: normal conjunctiva, sclera clear LUNGS: normal breathing effort HEART: regular rate & rhythm ABDOMEN: abdomen soft, non-tender and nondistended  RADIOGRAPHIC STUDIES: I have personally reviewed the radiological images as listed and agreed with the findings in the report. No results found.

## 2024-06-05 ENCOUNTER — Inpatient Hospital Stay

## 2024-06-05 ENCOUNTER — Telehealth: Payer: Self-pay | Admitting: *Deleted

## 2024-06-05 NOTE — Telephone Encounter (Signed)
 Called pt number on file and it is a non working number. Called pt brother Oletta Berry and asked that he let the pt know to call and reschedule her appt. Pt brother verbalized understanding.

## 2024-06-10 ENCOUNTER — Encounter: Admitting: Internal Medicine

## 2024-06-24 ENCOUNTER — Inpatient Hospital Stay (HOSPITAL_BASED_OUTPATIENT_CLINIC_OR_DEPARTMENT_OTHER): Admitting: Hematology

## 2024-06-24 ENCOUNTER — Other Ambulatory Visit: Payer: Self-pay

## 2024-06-24 ENCOUNTER — Inpatient Hospital Stay: Attending: Hematology

## 2024-06-24 VITALS — BP 120/64 | HR 74 | Temp 97.3°F | Resp 20 | Wt 261.6 lb

## 2024-06-24 DIAGNOSIS — Z79899 Other long term (current) drug therapy: Secondary | ICD-10-CM

## 2024-06-24 DIAGNOSIS — N920 Excessive and frequent menstruation with regular cycle: Secondary | ICD-10-CM

## 2024-06-24 DIAGNOSIS — D509 Iron deficiency anemia, unspecified: Secondary | ICD-10-CM

## 2024-06-24 DIAGNOSIS — D500 Iron deficiency anemia secondary to blood loss (chronic): Secondary | ICD-10-CM

## 2024-06-24 LAB — CBC WITH DIFFERENTIAL (CANCER CENTER ONLY)
Abs Immature Granulocytes: 0.01 10*3/uL (ref 0.00–0.07)
Basophils Absolute: 0.1 10*3/uL (ref 0.0–0.1)
Basophils Relative: 1 %
Eosinophils Absolute: 0.1 10*3/uL (ref 0.0–0.5)
Eosinophils Relative: 2 %
HCT: 30.4 % — ABNORMAL LOW (ref 36.0–46.0)
Hemoglobin: 9.1 g/dL — ABNORMAL LOW (ref 12.0–15.0)
Immature Granulocytes: 0 %
Lymphocytes Relative: 35 %
Lymphs Abs: 1.9 10*3/uL (ref 0.7–4.0)
MCH: 20.2 pg — ABNORMAL LOW (ref 26.0–34.0)
MCHC: 29.9 g/dL — ABNORMAL LOW (ref 30.0–36.0)
MCV: 67.4 fL — ABNORMAL LOW (ref 80.0–100.0)
Monocytes Absolute: 0.5 10*3/uL (ref 0.1–1.0)
Monocytes Relative: 10 %
Neutro Abs: 2.9 10*3/uL (ref 1.7–7.7)
Neutrophils Relative %: 52 %
Platelet Count: 264 10*3/uL (ref 150–400)
RBC: 4.51 MIL/uL (ref 3.87–5.11)
RDW: 21 % — ABNORMAL HIGH (ref 11.5–15.5)
Smear Review: NORMAL
WBC Count: 5.4 10*3/uL (ref 4.0–10.5)
nRBC: 0 % (ref 0.0–0.2)

## 2024-06-24 LAB — IRON AND IRON BINDING CAPACITY (CC-WL,HP ONLY)
Iron: 22 ug/dL — ABNORMAL LOW (ref 28–170)
Saturation Ratios: 5 % — ABNORMAL LOW (ref 10.4–31.8)
TIBC: 463 ug/dL — ABNORMAL HIGH (ref 250–450)
UIBC: 441 ug/dL (ref 148–442)

## 2024-06-24 LAB — VITAMIN B12: Vitamin B-12: 353 pg/mL (ref 180–914)

## 2024-06-24 NOTE — Progress Notes (Signed)
 HEMATOLOGY/ONCOLOGY CONSULTATION NOTE  Date of Service: 06/24/2024  Patient Care Team: Delores Suzann HERO, MD as PCP - General (Family Medicine) Delores Suzann HERO, MD (Family Medicine) Delores Suzann HERO, MD (Family Medicine) Merlynn Lyle CROME, LCSW as [facility] Care Management (Licensed Clinical Social Worker)  CHIEF COMPLAINTS/PURPOSE OF CONSULTATION:  Iron  deficiency anemia  HISTORY OF PRESENTING ILLNESS:   Kathy Richards is a wonderful 33 y.o. female who has been referred to us  by Orlando Pond, DO for evaluation and management of iron  deficiency anemia.  Patient is accompanied by her brother during today's visit. She received labs in April showing she is fairly iron  deficient with ferritin 5.7 and somewhat anemic with hgb 9.1.   She is seeing her gastroenterologist, Dr. Federico and PA Alan Coombs for abdominal pain in left lower quadrant.   She complains of intermittent stomach pain since 2023 in the left lower abdomen, which does worsen during her periods. She has seen OB/GYN in the [place]  for evaluation of this. She received an ultrasound which showed findings of uterine fibroids in the uterus as well as other findings in the abdm. CT scan in December showed that her stomach was rather large, though there was some uncertainty in regards to whether if this is related to obstruction or stomach moving slowly. Patient reports being told that her stomach was moving slowly. She reports that her GI is planning endoscopy and colonoscopy. She reports that she is hesitant to proceed with endoscopy/colonoscopy. Patient denies upper abdominal pain. Patient denies any issues passing urine.   Patient is noted to have two previous C-sections in 2013 and 2021. She notes that her baby was larger in size but denies any issues with her C section surgeries.   She reports that she is originally from [place]. Patient been in the [place]  [place] 4 [place]. She reports that since living  in the [place] , she has only had issues with anemia and denies any other medical issues.   She does not take any vitamins on a regular basis.   She has had heavy periods since coming to the [place]  4-5 years ago. Her heavy periods typically last 5 days. She notes that when her periods were previously normal, they also lasted 5 days, but were not heavy. She reports some clotting with menstrual flow at this time.   She notes that she has tried medication to try to control her menstrual cycle managed by OB/GYN. Patient denies being on birthcontrol at this time. She notes that she used to be on birth control but cause headaches.   She complains of increased HR, fatigue, and mild dizziness.   She also reports that sometimes she feels pumping sensation of blood vessels in her head.   She takes oral iron  ferrous sulphate every other day. Patient has been on oral iron  for 1-2 months.   She is no longer on Protonix  since January. She was previosuly on it since October.   Patient is not on Letrozole  at this time.   She denies blood in stools, black stools, ice cravings, changes in bowel habits, or unexpected weight loss.   She complains of headaches, which are typically present with climbing stairs and increased walking.   She notes that a CT scan was ordered but was then cancelled, and has not followed up on this.   She reports that she did previously receive IV iron  at [facility] last year, which was set up by her women center doctor. She  received 1 dose of IV iron  for 1 week.   She reports previously having 1-2 blood transfusions at an emergency department.   MEDICAL HISTORY:  Past Medical History:  Diagnosis Date   GERD (gastroesophageal reflux disease)    Microcytic anemia 11/21/2020   Obesity     SURGICAL HISTORY: Past Surgical History:  Procedure Laterality Date   CESAREAN SECTION     x2    SOCIAL HISTORY: Social History   Socioeconomic History   Marital status: Single     Spouse name: Not on file   Number of children: 1   Years of education: Not on file   Highest education level: Not on file  Occupational History   Occupation: nif scanner  Tobacco Use   Smoking status: Never   Smokeless tobacco: Never  Vaping Use   Vaping status: Never Used  Substance and Sexual Activity   Alcohol use: Not Currently   Drug use: Never   Sexual activity: Yes    Partners: Male    Birth control/protection: None    Comment: husband in [place]   Other Topics Concern   Not on file  Social History Narrative   ** Merged History Encounter **       Mother is Mbiya Mulumba   Husband is still in [place]   Refugee Information Number of Immediate Family Members: 8 Number of Immediate Family Members in [place] : 7 Date of Arrival: 08/03/20 Country of Birth: Other Other Country of Birth:: [place] Count   ry of Origin: Other Other Country of Origin:: [place] Location of Refugee [place]: Other Other Location of Refugee Camp:: [place] Duration in [place]: 20 years or greater Reason for Leaving Home Country: Other Other Reason for Leaving Home Country:: born [place]   n the refugee [place] Primary Language: Other Other Primary Language:: swahili, nyanja and understanding of english Able to Read in Primary Language: Yes (a little) Able to Write in Primary Language: Yes (a little) Education: [employer] (two years fro   m completing) Prior Work: business; Secondary school teacher and shoes Marital Status: Married Sexual Activity: No Tuberculosis Screening Overseas: Negative Tuberculosis Screening Health Department: Not Completed Health Department Labs Co   mpleted: No History of Trauma: None Do You Feel Jumpy or Nervous?: No Are You Very Watchful or 'Super Alert'?: Yes (feels unsafe in her neighborhood.)     Social Drivers of Health   Financial Resource Strain: Medium Risk (05/02/2021)   Overall Financial Resource Strain (CARDIA)    Difficulty of Paying Living Expenses:  Somewhat hard  Food Insecurity: Food Insecurity Present (08/20/2023)   Hunger Vital Sign    Worried About Running Out of Food in the Last Year: Sometimes true    Ran Out of Food in the Last Year: Sometimes true  Transportation Needs: No Transportation Needs (08/20/2023)   PRAPARE - Administrator, Civil Service (Medical): No    Lack of Transportation (Non-Medical): No  Physical Activity: Not on file  Stress: No Stress Concern Present (05/02/2021)   [employer] of Occupational Health - Occupational Stress Questionnaire    Feeling of Stress : Only a little  Social Connections: Not on file  Intimate Partner Violence: Not At Risk (05/02/2021)   Humiliation, Afraid, Rape, and Kick questionnaire    Fear of Current or Ex-Partner: No    Emotionally Abused: No    Physically Abused: No    Sexually Abused: No    FAMILY HISTORY: Family History  Problem Relation Age of  Onset   HIV Mother    Hypertension Father    Liver disease Neg Hx    Colon cancer Neg Hx    Esophageal cancer Neg Hx     ALLERGIES:  has no known allergies.  MEDICATIONS:  Current Outpatient Medications  Medication Sig Dispense Refill   acetaminophen  (TYLENOL ) 500 MG tablet Take 2 tablets (1,000 mg total) by mouth every 6 (six) hours as needed. 90 tablet 0   ferrous sulfate  324 MG TBEC Take 1 tablet (324 mg total) by mouth every other day. 100 tablet 3   letrozole  (FEMARA ) 2.5 MG tablet Take 1 tablet (2.5 mg total) by mouth daily. Take on days 3 to 7 following a spontaneous menses or progestin-induced bleed. (Patient not taking: Reported on 04/20/2024) 5 tablet 2   naproxen  (NAPROSYN ) 375 MG tablet Take 1 tablet (375 mg total) by mouth 2 (two) times daily. (Patient not taking: Reported on 04/20/2024) 20 tablet 0   pantoprazole  (PROTONIX ) 20 MG tablet Take 1 tablet (20 mg total) by mouth daily. 30 tablet 0   progesterone  (PROMETRIUM ) 100 MG capsule Take 100 mg by mouth daily. (Patient not taking: Reported on  04/20/2024)     tranexamic acid  (LYSTEDA ) 650 MG TABS tablet Take 2 tablets (1,300 mg total) by mouth 3 (three) times daily. Take during menses for a maximum of five days (Patient not taking: Reported on 04/20/2024) 30 tablet 2   No current facility-administered medications for this visit.    REVIEW OF SYSTEMS:    10 Point review of Systems was done is negative except as noted above.  PHYSICAL EXAMINATION: ECOG PERFORMANCE STATUS: 1 - Symptomatic but completely ambulatory  . Vitals:   06/24/24 1431  BP: 120/64  Pulse: 74  Resp: 20  Temp: (!) 97.3 F (36.3 C)  SpO2: 99%   Filed Weights   06/24/24 1431  Weight: 261 lb 9.6 oz (118.7 kg)   .Body mass index is 40.97 kg/m.  GENERAL:alert, in no acute distress and comfortable SKIN: no acute rashes, no significant lesions EYES: conjunctiva are pink and non-injected, sclera anicteric OROPHARYNX: MMM, no exudates, no oropharyngeal erythema or ulceration NECK: supple, no JVD LYMPH:  no palpable lymphadenopathy in the cervical, axillary or inguinal regions LUNGS: clear to auscultation b/l with normal respiratory effort HEART: regular rate & rhythm ABDOMEN:  normoactive bowel sounds , non tender, not distended. Extremity: no pedal edema PSYCH: alert & oriented x 3 with fluent speech NEURO: no focal motor/sensory deficits  LABORATORY DATA:  I have reviewed the data as listed  .    Latest Ref Rng & Units 06/24/2024    3:20 PM 04/20/2024   12:15 PM 12/16/2023    5:45 PM  CBC  WBC 4.0 - 10.5 K/uL 5.4  4.1  4.8   Hemoglobin 12.0 - 15.0 g/dL 9.1  9.1  89.4   Hematocrit 36.0 - 46.0 % 30.4  29.8  35.4   Platelets 150 - 400 K/uL 264  241.0  283     .    Latest Ref Rng & Units 04/20/2024   12:15 PM 12/16/2023    5:45 PM 11/11/2023   11:07 AM  CMP  Glucose 70 - 99 mg/dL 97  94  88   BUN 6 - 23 mg/dL 8  5  9    Creatinine 0.40 - 1.20 mg/dL 9.37  9.23  9.22   Sodium 135 - 145 mEq/L 135  136  136   Potassium 3.5 - 5.1 mEq/L 3.9  3.4  4.1   Chloride 96 - 112 mEq/L 105  104  104   CO2 19 - 32 mEq/L 24  26  21    Calcium 8.4 - 10.5 mg/dL 9.2  9.4  9.3   Total Protein 6.0 - 8.3 g/dL 8.0  7.8  7.1   Total Bilirubin 0.2 - 1.2 mg/dL 0.4  0.2  <9.7   Alkaline Phos 39 - 117 U/L 92  97  127   AST 0 - 37 U/L 11  14  14    ALT 0 - 35 U/L 8  11  11       RADIOGRAPHIC STUDIES: I have personally reviewed the radiological images as listed and agreed with the findings in the report. No results found.  ASSESSMENT & PLAN:   33 y.o. female with:  Iron  deficiency anemia most likely related to heavy menstrual losses  PLAN:  -CBC from 04/20/2024 showed hemoglobin 9.1 -educated patient that normal hgb typically ranges 12-16 -ferritin was 5.7 -her RBCs are low primarily from iron  deficiency -patient is unlikely to have a primary blood disorder -it is most likely that her iron  deficiency is primarily from heavy periods -discussed that blood clotting with her menstrual periods tends to suggest heavy menstrual flow -discussed that low blood counts can cause dizziness -discussed that pumping sensation in the head can occur with anemia or elevated blood pressures.  -educated patient that iron  is needed for RBCs to carry O2 in the body and for muscle activity -discussed that if there is active blood loss with her periods, oral iron  may not be adequate to replace her iron  needs quick enough. Discussed Option of IV iron .  -discussed that IV iron  would replace her iron  levels quicker than oral iron   -patient is agreeable to receiving IV iron   -will set up IV iron  with infusion center at [facility] -will repeat labs in 2-3 months after her IV iron  -discussed goal to generally avoid blood transfusions -recommend connecting with her OB/GYN to discuss options to slow her menstrual bleeding  -discussed that sometimes there can be an element of endometriosis. Discussed that if endometriosis is localized, there may be a role for surgical  removal or is this is not possible, potentially blocking with hormones.  -recommend connecting with gastroenterologist for GI workup to evaluate to see if there is any other source of bleeding or difficulty absorbing iron  from GI issues.  -recommend endoscopy and colonoscopy with gastroenterology to ensure there is no obstruction and evaluate for any factors such as ulcers, blockage, or inflammation causing stomach pain -recommend taking one capsule of vitamin B complex daily at least for 3 months -will order blood tests today to ensure there is no need for blood transfusion -answered all of patient's questions in detail  The total time spent in the appointment was 45 minutes* .  All of the patient's questions were answered with apparent satisfaction. The patient knows to call the clinic with any problems, questions or concerns.   Emaline Saran MD MS AAHIVMS [facility] Hematology/Oncology Physician [facility]  .*Total Encounter Time as defined by the Centers for Medicare and Medicaid Services includes, in addition to the face-to-face time of a patient visit (documented in the note above) non-face-to-face time: obtaining and reviewing outside history, ordering and reviewing medications, tests or procedures, care coordination (communications with other health care professionals or caregivers) and documentation in the medical record.    I,Mitra Faeizi,acting as a Neurosurgeon for Emaline Saran, MD.,have documented all relevant  documentation on the behalf of Emaline Saran, MD,as directed by  Emaline Saran, MD while in the presence of Emaline Saran, MD.  .I have reviewed the above documentation for accuracy and completeness, and I agree with the above. .Ltanya Bayley Kishore Leslea Vowles MD

## 2024-06-25 ENCOUNTER — Telehealth: Payer: Self-pay

## 2024-06-25 LAB — HGB FRACTIONATION CASCADE
Hgb A2: 2.6 % (ref 1.8–3.2)
Hgb A: 97.4 % (ref 96.4–98.8)
Hgb F: 0 % (ref 0.0–2.0)
Hgb S: 0 %

## 2024-06-25 LAB — FERRITIN: Ferritin: 12 ng/mL (ref 11–307)

## 2024-06-25 NOTE — Telephone Encounter (Signed)
 [facility] Clinical Social Work  Clinical Social Work was referred by Engineer, civil (consulting) for [employer] needs.  Clinical Social Worker attempted to contact patient by phone with interpreter [phone] to offer support and assess for needs.     Interventions: CSW left VM with direct contact.      Follow Up Plan:  CSW will attempt to reach patient again. Deferred for now.    Lizbeth Sprague, LCSW  Clinical Social Worker [facility]

## 2024-06-29 ENCOUNTER — Telehealth: Payer: Self-pay

## 2024-06-29 ENCOUNTER — Other Ambulatory Visit (HOSPITAL_COMMUNITY): Payer: Self-pay

## 2024-06-29 DIAGNOSIS — D500 Iron deficiency anemia secondary to blood loss (chronic): Secondary | ICD-10-CM

## 2024-06-29 NOTE — Telephone Encounter (Signed)
 [facility] Clinical Social Work  Clinical Social Work was referred by [employer] screen protocol for SDOH needs.  Clinical Social Worker attempted to contact patient by phone to offer support and assess for needs.     Interventions: CSW left VM x2.       Follow Up Plan:  No Follow up. Referral will be closed.    Lizbeth Sprague, LCSW  Clinical Social Worker [facility]

## 2024-06-29 NOTE — Telephone Encounter (Signed)
 Dr. Onesimo, patient will be scheduled as soon as possible.  Auth Submission: NO AUTH NEEDED Site of care: Site of care: [facility] Payer: [employer] medicaid Medication & CPT/J Code(s) submitted: Monoferric (Ferrci derisomaltose) [phone] Diagnosis Code:  Route of submission (phone, fax, portal): phone Phone # [phone] Fax # Auth type: Buy/Bill PB Units/visits requested: 1000mg  x 1 dose Reference number: automated system Approval from: 06/29/24 to 09/29/24

## 2024-07-08 ENCOUNTER — Ambulatory Visit (HOSPITAL_COMMUNITY)
Admission: RE | Admit: 2024-07-08 | Discharge: 2024-07-08 | Disposition: A | Discharge status: Home / Self Care | Source: Ambulatory Visit | Attending: Hematology | Admitting: Hematology

## 2024-07-08 DIAGNOSIS — D500 Iron deficiency anemia secondary to blood loss (chronic): Secondary | ICD-10-CM | POA: Diagnosis present

## 2024-07-08 MED ORDER — SODIUM CHLORIDE 0.9 % IV SOLN
1000.0000 mg | Freq: Once | INTRAVENOUS | Status: AC
  Administered 2024-07-08: 1000 mg via INTRAVENOUS
  Filled 2024-07-08: qty 10

## 2024-08-03 ENCOUNTER — Ambulatory Visit (HOSPITAL_COMMUNITY)
Admission: EM | Admit: 2024-08-03 | Discharge: 2024-08-03 | Disposition: A | Discharge status: Home / Self Care | Attending: Family Medicine | Admitting: Family Medicine

## 2024-08-03 ENCOUNTER — Ambulatory Visit (HOSPITAL_COMMUNITY)

## 2024-08-03 ENCOUNTER — Encounter (HOSPITAL_COMMUNITY): Payer: Self-pay

## 2024-08-03 DIAGNOSIS — S9002XA Contusion of left ankle, initial encounter: Secondary | ICD-10-CM

## 2024-08-03 DIAGNOSIS — M25572 Pain in left ankle and joints of left foot: Secondary | ICD-10-CM

## 2024-08-03 DIAGNOSIS — S90512A Abrasion, left ankle, initial encounter: Secondary | ICD-10-CM | POA: Diagnosis not present

## 2024-08-03 MED ORDER — KETOROLAC TROMETHAMINE 60 MG/2ML IM SOLN
INTRAMUSCULAR | Status: AC
  Filled 2024-08-03: qty 2

## 2024-08-03 MED ORDER — IBUPROFEN 800 MG PO TABS: 800.0000 mg | ORAL_TABLET | Freq: Three times a day (TID) | ORAL | 0 refills | Status: DC | PRN

## 2024-08-03 MED ORDER — KETOROLAC TROMETHAMINE 60 MG/2ML IM SOLN
60.0000 mg | Freq: Once | INTRAMUSCULAR | Status: AC
  Administered 2024-08-03 (×2): 60 mg via INTRAMUSCULAR
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
## 2024-08-03 NOTE — ED Triage Notes (Signed)
 Patient here today with c/o left ankle injury today. Patient was at work using a Neurosurgeon and when she was bringing the jack backwards it went too fast and hit her in the left ankle. Patient has a small cut on her ankle from the impact.

## 2024-08-03 NOTE — Discharge Instructions (Addendum)
 You have a soft tissue injury to your left ankle from being struck by a pallet jack. Your x-ray today did not show any fracture or break on my initial review; the final radiologist report is still pending, and our clinic will contact you only if any abnormalities are identified. Otherwise, you can review your results on MyChart. Keep your ankle elevated above the level of your heart as much as possible to help reduce swelling. Apply ice packs for 20 minutes at a time, several times a day, making sure to place a towel between the ice and your skin. Wear the walking boot for support during the day and remove it for showers and sleep. Take ibuprofen  as prescribed for pain and inflammation, and do not take any additional over-the-counter NSAIDs while on this medication. If you need more pain relief, you may take acetaminophen  as directed. Rest the ankle and avoid activities that cause pain, but gentle movement without excessive strain is encouraged as tolerated. Follow up with orthopedics at [facility] if your symptoms are not improving or if the radiologist's report shows an abnormality. Seek immediate medical care if you develop severe pain, inability to bear weight, worsening swelling, numbness, tingling, color changes in your toes, or any other concerning symptoms.

## 2024-09-17 ENCOUNTER — Ambulatory Visit (HOSPITAL_COMMUNITY)
Admission: EM | Admit: 2024-09-17 | Discharge: 2024-09-17 | Disposition: A | Discharge status: Home / Self Care | Attending: Family Medicine | Admitting: Family Medicine

## 2024-09-17 ENCOUNTER — Encounter (HOSPITAL_COMMUNITY): Payer: Self-pay

## 2024-09-17 DIAGNOSIS — R42 Dizziness and giddiness: Secondary | ICD-10-CM

## 2024-09-17 DIAGNOSIS — N939 Abnormal uterine and vaginal bleeding, unspecified: Secondary | ICD-10-CM | POA: Insufficient documentation

## 2024-09-17 DIAGNOSIS — D500 Iron deficiency anemia secondary to blood loss (chronic): Secondary | ICD-10-CM | POA: Insufficient documentation

## 2024-09-17 LAB — CBC WITH DIFFERENTIAL/PLATELET
Abs Immature Granulocytes: 0.01 K/uL (ref 0.00–0.07)
Basophils Absolute: 0.1 K/uL (ref 0.0–0.1)
Basophils Relative: 1 %
Eosinophils Absolute: 0.1 K/uL (ref 0.0–0.5)
Eosinophils Relative: 2 %
HCT: 30.4 % — ABNORMAL LOW (ref 36.0–46.0)
Hemoglobin: 9.3 g/dL — ABNORMAL LOW (ref 12.0–15.0)
Immature Granulocytes: 0 %
Lymphocytes Relative: 43 %
Lymphs Abs: 2.4 K/uL (ref 0.7–4.0)
MCH: 22.6 pg — ABNORMAL LOW (ref 26.0–34.0)
MCHC: 30.6 g/dL (ref 30.0–36.0)
MCV: 73.8 fL — ABNORMAL LOW (ref 80.0–100.0)
Monocytes Absolute: 0.4 K/uL (ref 0.1–1.0)
Monocytes Relative: 7 %
Neutro Abs: 2.6 K/uL (ref 1.7–7.7)
Neutrophils Relative %: 47 %
Platelets: 229 K/uL (ref 150–400)
RBC: 4.12 MIL/uL (ref 3.87–5.11)
RDW: 18.8 % — ABNORMAL HIGH (ref 11.5–15.5)
WBC: 5.6 K/uL (ref 4.0–10.5)
nRBC: 0 % (ref 0.0–0.2)

## 2024-09-17 LAB — COMPREHENSIVE METABOLIC PANEL WITH GFR
ALT: 13 U/L (ref 0–44)
AST: 16 U/L (ref 15–41)
Albumin: 3.6 g/dL (ref 3.5–5.0)
Alkaline Phosphatase: 83 U/L (ref 38–126)
Anion gap: 8 (ref 5–15)
BUN: 7 mg/dL (ref 6–20)
CO2: 24 mmol/L (ref 22–32)
Calcium: 8.7 mg/dL — ABNORMAL LOW (ref 8.9–10.3)
Chloride: 105 mmol/L (ref 98–111)
Creatinine, Ser: 0.68 mg/dL (ref 0.44–1.00)
GFR, Estimated: >60 mL/min (ref >60)
Glucose, Bld: 87 mg/dL (ref 70–99)
Potassium: 3.3 mmol/L — ABNORMAL LOW (ref 3.5–5.1)
Sodium: 137 mmol/L (ref 135–145)
Total Bilirubin: 0.6 mg/dL (ref 0.0–1.2)
Total Protein: 7.1 g/dL (ref 6.5–8.1)

## 2024-09-17 LAB — POCT URINE PREGNANCY: Preg Test, Ur: NEGATIVE
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
## 2024-09-17 NOTE — Discharge Instructions (Signed)
 We are checking lab work today and we will contact you for urgent follow-up as needed.  Please follow-up with your OB/GYN for continued irregular menstrual cycles and vaginal bleeding.   Seek immediate care at the nearest emergency department if you develop severe abdominal pain, syncope, or new concerning symptoms.

## 2024-09-17 NOTE — ED Triage Notes (Signed)
 Patient states while at work today she started to have heavy vaginal bleeding with clots. Has also been feeling dizzy and weak for the last 5-7 days.   States her period is heavy but not like this. LMP before today was Tuesday this week.

## 2024-09-18 ENCOUNTER — Ambulatory Visit: Payer: Self-pay | Admitting: Emergency Medicine
